# Patient Record
Sex: Female | Born: 1965 | Race: White | Hispanic: No | Marital: Married | Smoking: Current every day smoker
Health system: Southern US, Community
[De-identification: ages and names within clinical notes are randomized; demographics above are authoritative.]

## PROBLEM LIST (undated history)

## (undated) DIAGNOSIS — I10 Essential (primary) hypertension: Secondary | ICD-10-CM

## (undated) DIAGNOSIS — T83711A Erosion of implanted vaginal mesh and other prosthetic materials to surrounding organ or tissue, initial encounter: Secondary | ICD-10-CM

## (undated) DIAGNOSIS — E785 Hyperlipidemia, unspecified: Secondary | ICD-10-CM

## (undated) HISTORY — PX: DILATION AND CURETTAGE OF UTERUS: SHX78

## (undated) HISTORY — DX: Hyperlipidemia, unspecified: E78.5

---

## 1999-04-11 ENCOUNTER — Other Ambulatory Visit: Admission: RE | Admit: 1999-04-11 | Discharge: 1999-04-11 | Payer: Self-pay | Admitting: *Deleted

## 2000-05-01 ENCOUNTER — Other Ambulatory Visit: Admission: RE | Admit: 2000-05-01 | Discharge: 2000-05-01 | Payer: Self-pay | Admitting: *Deleted

## 2001-04-16 ENCOUNTER — Other Ambulatory Visit: Admission: RE | Admit: 2001-04-16 | Discharge: 2001-04-16 | Payer: Self-pay | Admitting: *Deleted

## 2002-08-09 ENCOUNTER — Other Ambulatory Visit: Admission: RE | Admit: 2002-08-09 | Discharge: 2002-08-09 | Payer: Self-pay | Admitting: Family Medicine

## 2002-09-06 ENCOUNTER — Encounter: Payer: Self-pay | Admitting: Cardiology

## 2002-09-06 ENCOUNTER — Encounter: Admission: RE | Admit: 2002-09-06 | Discharge: 2002-09-06 | Payer: Self-pay | Admitting: Cardiology

## 2002-09-07 ENCOUNTER — Encounter: Payer: Self-pay | Admitting: Cardiology

## 2002-09-07 ENCOUNTER — Ambulatory Visit (HOSPITAL_COMMUNITY): Admission: RE | Admit: 2002-09-07 | Discharge: 2002-09-07 | Payer: Self-pay | Admitting: Cardiology

## 2007-08-10 ENCOUNTER — Encounter: Admission: RE | Admit: 2007-08-10 | Discharge: 2007-08-10 | Payer: Self-pay | Admitting: Family Medicine

## 2007-08-17 ENCOUNTER — Encounter: Admission: RE | Admit: 2007-08-17 | Discharge: 2007-08-17 | Payer: Self-pay | Admitting: Family Medicine

## 2008-09-03 ENCOUNTER — Encounter: Admission: RE | Admit: 2008-09-03 | Discharge: 2008-09-03 | Payer: Self-pay | Admitting: Family Medicine

## 2008-12-17 ENCOUNTER — Emergency Department (HOSPITAL_COMMUNITY): Admission: EM | Admit: 2008-12-17 | Discharge: 2008-12-17 | Payer: Self-pay | Admitting: Family Medicine

## 2011-01-29 ENCOUNTER — Other Ambulatory Visit: Payer: Self-pay | Admitting: Obstetrics and Gynecology

## 2011-01-29 DIAGNOSIS — R928 Other abnormal and inconclusive findings on diagnostic imaging of breast: Secondary | ICD-10-CM

## 2011-02-05 ENCOUNTER — Ambulatory Visit
Admission: RE | Admit: 2011-02-05 | Discharge: 2011-02-05 | Disposition: A | Discharge status: Home / Self Care | Payer: Federal, State, Local not specified - PPO | Source: Ambulatory Visit | Attending: Obstetrics and Gynecology | Admitting: Obstetrics and Gynecology

## 2011-02-05 DIAGNOSIS — R928 Other abnormal and inconclusive findings on diagnostic imaging of breast: Secondary | ICD-10-CM

## 2014-02-03 ENCOUNTER — Other Ambulatory Visit: Payer: Self-pay | Admitting: Obstetrics & Gynecology

## 2014-02-16 ENCOUNTER — Encounter (HOSPITAL_COMMUNITY)
Admission: RE | Admit: 2014-02-16 | Discharge: 2014-02-16 | Disposition: A | Discharge status: Home / Self Care | Payer: Federal, State, Local not specified - PPO | Source: Ambulatory Visit | Attending: Obstetrics & Gynecology | Admitting: Obstetrics & Gynecology

## 2014-02-16 ENCOUNTER — Encounter (HOSPITAL_COMMUNITY): Payer: Self-pay

## 2014-02-16 DIAGNOSIS — Z01818 Encounter for other preprocedural examination: Secondary | ICD-10-CM | POA: Diagnosis present

## 2014-02-16 DIAGNOSIS — N812 Incomplete uterovaginal prolapse: Secondary | ICD-10-CM | POA: Insufficient documentation

## 2014-02-16 DIAGNOSIS — N393 Stress incontinence (female) (male): Secondary | ICD-10-CM | POA: Insufficient documentation

## 2014-02-16 HISTORY — DX: Essential (primary) hypertension: I10

## 2014-02-16 LAB — CBC
HEMATOCRIT: 41.8 % (ref 36.0–46.0)
HEMOGLOBIN: 14.6 g/dL (ref 12.0–15.0)
MCH: 31.1 pg (ref 26.0–34.0)
MCHC: 34.9 g/dL (ref 30.0–36.0)
MCV: 88.9 fL (ref 78.0–100.0)
Platelets: 224 10*3/uL (ref 150–400)
RBC: 4.7 MIL/uL (ref 3.87–5.11)
RDW: 13 % (ref 11.5–15.5)
WBC: 10.6 10*3/uL — ABNORMAL HIGH (ref 4.0–10.5)

## 2014-02-16 LAB — BASIC METABOLIC PANEL
Anion gap: 10 (ref 5–15)
BUN: 14 mg/dL (ref 6–23)
CO2: 27 mEq/L (ref 19–32)
CREATININE: 0.77 mg/dL (ref 0.50–1.10)
Calcium: 9.3 mg/dL (ref 8.4–10.5)
Chloride: 102 mEq/L (ref 96–112)
GLUCOSE: 88 mg/dL (ref 70–99)
POTASSIUM: 4.2 meq/L (ref 3.7–5.3)
Sodium: 139 mEq/L (ref 137–147)

## 2014-02-16 NOTE — Patient Instructions (Signed)
20 CHANIKA BYLAND  02/16/2014   Your procedure is scheduled on:  02/21/14  Enter through the Main Entrance of [facility] up the phone at the desk and dial [phone].   Call this number if you have problems the morning of surgery: [phone]   Remember:   Do not eat food:After Midnight.  Do not drink clear liquids: 4 Hours before arrival.  Take these medicines the morning of surgery with A SIP OF WATER: blood pressure medications   Do not wear jewelry, make-up or nail polish.  Do not wear lotions, powders, or perfumes. You may wear deodorant.  Do not shave 48 hours prior to surgery.  Do not bring valuables to the hospital.  [facility] is not   responsible for any belongings or valuables brought to the hospital.  Contacts, dentures or bridgework may not be worn into surgery.  Leave suitcase in the car. After surgery it may be brought to your room.  For patients admitted to the hospital, checkout time is 11:00 AM the day of              discharge.   Patients discharged the day of surgery will not be allowed to drive             home.  Name and phone number of your driver: NA  Special Instructions:      Please read over the following fact sheets that you were given:   Surgical Site Infection Prevention

## 2014-02-16 NOTE — Pre-Procedure Instructions (Signed)
EKG reviewed and accepted by Dr Primitivo Gauze.

## 2014-02-21 ENCOUNTER — Encounter (HOSPITAL_COMMUNITY)
Admission: RE | Disposition: A | Discharge status: Home / Self Care | Payer: Self-pay | Source: Ambulatory Visit | Attending: Obstetrics & Gynecology

## 2014-02-21 ENCOUNTER — Ambulatory Visit (HOSPITAL_COMMUNITY): Payer: Federal, State, Local not specified - PPO | Admitting: Anesthesiology

## 2014-02-21 ENCOUNTER — Encounter (HOSPITAL_COMMUNITY): Payer: Self-pay | Admitting: Anesthesiology

## 2014-02-21 ENCOUNTER — Encounter (HOSPITAL_COMMUNITY): Payer: Federal, State, Local not specified - PPO | Admitting: Anesthesiology

## 2014-02-21 ENCOUNTER — Observation Stay (HOSPITAL_COMMUNITY)
Admission: RE | Admit: 2014-02-21 | Discharge: 2014-02-22 | Disposition: A | Discharge status: Home / Self Care | Payer: Federal, State, Local not specified - PPO | Source: Ambulatory Visit | Attending: Obstetrics & Gynecology | Admitting: Obstetrics & Gynecology

## 2014-02-21 DIAGNOSIS — D251 Intramural leiomyoma of uterus: Secondary | ICD-10-CM | POA: Diagnosis not present

## 2014-02-21 DIAGNOSIS — N8111 Cystocele, midline: Secondary | ICD-10-CM | POA: Diagnosis not present

## 2014-02-21 DIAGNOSIS — N393 Stress incontinence (female) (male): Secondary | ICD-10-CM | POA: Insufficient documentation

## 2014-02-21 DIAGNOSIS — Z90710 Acquired absence of both cervix and uterus: Secondary | ICD-10-CM

## 2014-02-21 DIAGNOSIS — I10 Essential (primary) hypertension: Secondary | ICD-10-CM | POA: Insufficient documentation

## 2014-02-21 DIAGNOSIS — F17200 Nicotine dependence, unspecified, uncomplicated: Secondary | ICD-10-CM | POA: Diagnosis not present

## 2014-02-21 HISTORY — PX: VAGINAL HYSTERECTOMY: SHX2639

## 2014-02-21 HISTORY — PX: BLADDER SUSPENSION: SHX72

## 2014-02-21 HISTORY — DX: Acquired absence of both cervix and uterus: Z90.710

## 2014-02-21 HISTORY — PX: ANTERIOR AND POSTERIOR REPAIR: SHX5121

## 2014-02-21 LAB — PREGNANCY, URINE: Preg Test, Ur: NEGATIVE

## 2014-02-21 LAB — ABO/RH: ABO/RH(D): A POS

## 2014-02-21 LAB — TYPE AND SCREEN
ABO/RH(D): A POS
ANTIBODY SCREEN: NEGATIVE

## 2014-02-21 SURGERY — HYSTERECTOMY, VAGINAL
Anesthesia: General | Site: Vagina

## 2014-02-21 MED ORDER — ROCURONIUM BROMIDE 100 MG/10ML IV SOLN
INTRAVENOUS | Status: DC | PRN
  Administered 2014-02-21: 10 mg via INTRAVENOUS

## 2014-02-21 MED ORDER — KETOROLAC TROMETHAMINE 30 MG/ML IJ SOLN
INTRAMUSCULAR | Status: AC
  Filled 2014-02-21: qty 1

## 2014-02-21 MED ORDER — ESTRADIOL 0.1 MG/GM VA CREA
TOPICAL_CREAM | VAGINAL | Status: AC
  Filled 2014-02-21: qty 42.5

## 2014-02-21 MED ORDER — FENTANYL CITRATE 0.05 MG/ML IJ SOLN
25.0000 ug | INTRAMUSCULAR | Status: DC | PRN
  Administered 2014-02-21 (×2): 50 ug via INTRAVENOUS

## 2014-02-21 MED ORDER — SCOPOLAMINE 1 MG/3DAYS TD PT72
MEDICATED_PATCH | TRANSDERMAL | Status: AC
  Administered 2014-02-21: 1.5 mg via TRANSDERMAL
  Filled 2014-02-21: qty 1

## 2014-02-21 MED ORDER — PROPOFOL 10 MG/ML IV BOLUS
INTRAVENOUS | Status: DC | PRN
  Administered 2014-02-21: 200 mg via INTRAVENOUS

## 2014-02-21 MED ORDER — LIDOCAINE-EPINEPHRINE (PF) 1 %-1:200000 IJ SOLN
INTRAMUSCULAR | Status: DC | PRN
  Administered 2014-02-21: 5 mL
  Administered 2014-02-21: 15 mL

## 2014-02-21 MED ORDER — SIMETHICONE 80 MG PO CHEW
80.0000 mg | CHEWABLE_TABLET | Freq: Four times a day (QID) | ORAL | Status: DC
  Administered 2014-02-21 – 2014-02-22 (×2): 80 mg via ORAL
  Filled 2014-02-21 (×2): qty 1

## 2014-02-21 MED ORDER — CEFAZOLIN SODIUM-DEXTROSE 2-3 GM-% IV SOLR
2.0000 g | INTRAVENOUS | Status: AC
  Administered 2014-02-21: 2 g via INTRAVENOUS

## 2014-02-21 MED ORDER — IBUPROFEN 600 MG PO TABS
600.0000 mg | ORAL_TABLET | Freq: Four times a day (QID) | ORAL | Status: DC | PRN
  Administered 2014-02-22: 600 mg via ORAL
  Filled 2014-02-21: qty 1

## 2014-02-21 MED ORDER — FENTANYL CITRATE 0.05 MG/ML IJ SOLN
INTRAMUSCULAR | Status: AC
  Administered 2014-02-21: 50 ug via INTRAVENOUS
  Filled 2014-02-21: qty 2

## 2014-02-21 MED ORDER — KETOROLAC TROMETHAMINE 30 MG/ML IJ SOLN
INTRAMUSCULAR | Status: DC | PRN
  Administered 2014-02-21: 30 mg via INTRAVENOUS

## 2014-02-21 MED ORDER — PNEUMOCOCCAL VAC POLYVALENT 25 MCG/0.5ML IJ INJ
0.5000 mL | INJECTION | INTRAMUSCULAR | Status: DC
  Filled 2014-02-21: qty 0.5

## 2014-02-21 MED ORDER — PROMETHAZINE HCL 25 MG/ML IJ SOLN: 6.2500 mg | INTRAMUSCULAR | Status: DC | PRN

## 2014-02-21 MED ORDER — FENTANYL CITRATE 0.05 MG/ML IJ SOLN
INTRAMUSCULAR | Status: DC | PRN
  Administered 2014-02-21 (×8): 50 ug via INTRAVENOUS

## 2014-02-21 MED ORDER — KETOROLAC TROMETHAMINE 30 MG/ML IJ SOLN
30.0000 mg | Freq: Four times a day (QID) | INTRAMUSCULAR | Status: DC
  Administered 2014-02-21 – 2014-02-22 (×2): 30 mg via INTRAVENOUS
  Filled 2014-02-21 (×2): qty 1

## 2014-02-21 MED ORDER — MIDAZOLAM HCL 2 MG/2ML IJ SOLN: 0.5000 mg | Freq: Once | INTRAMUSCULAR | Status: DC | PRN

## 2014-02-21 MED ORDER — CEFAZOLIN SODIUM-DEXTROSE 2-3 GM-% IV SOLR
INTRAVENOUS | Status: AC
  Filled 2014-02-21: qty 50

## 2014-02-21 MED ORDER — LIDOCAINE-EPINEPHRINE (PF) 1 %-1:200000 IJ SOLN
INTRAMUSCULAR | Status: AC
  Filled 2014-02-21: qty 10

## 2014-02-21 MED ORDER — ONDANSETRON HCL 4 MG PO TABS: 4.0000 mg | ORAL_TABLET | Freq: Four times a day (QID) | ORAL | Status: DC | PRN

## 2014-02-21 MED ORDER — STERILE WATER FOR IRRIGATION IR SOLN
Status: DC | PRN
  Administered 2014-02-21: 1000 mL

## 2014-02-21 MED ORDER — PROPOFOL 10 MG/ML IV EMUL
INTRAVENOUS | Status: AC
  Filled 2014-02-21: qty 20

## 2014-02-21 MED ORDER — LACTATED RINGERS IV SOLN
INTRAVENOUS | Status: DC
  Administered 2014-02-21 (×3): via INTRAVENOUS

## 2014-02-21 MED ORDER — METHYLENE BLUE 1 % INJ SOLN
INTRAMUSCULAR | Status: DC | PRN
  Administered 2014-02-21: 1 mL via INTRAVENOUS
  Administered 2014-02-21: 5 mL via INTRAVENOUS

## 2014-02-21 MED ORDER — GLYCOPYRROLATE 0.2 MG/ML IJ SOLN
INTRAMUSCULAR | Status: AC
  Filled 2014-02-21: qty 1

## 2014-02-21 MED ORDER — ONDANSETRON HCL 4 MG/2ML IJ SOLN
INTRAMUSCULAR | Status: DC | PRN
Start: 2014-02-21 — End: 2014-02-21
  Administered 2014-02-21: 4 mg via INTRAVENOUS

## 2014-02-21 MED ORDER — METHYLENE BLUE 1 % INJ SOLN
INTRAMUSCULAR | Status: AC
  Filled 2014-02-21: qty 10

## 2014-02-21 MED ORDER — ROCURONIUM BROMIDE 100 MG/10ML IV SOLN
INTRAVENOUS | Status: AC
  Filled 2014-02-21: qty 1

## 2014-02-21 MED ORDER — KETOROLAC TROMETHAMINE 30 MG/ML IJ SOLN: 15.0000 mg | Freq: Once | INTRAMUSCULAR | Status: DC | PRN

## 2014-02-21 MED ORDER — HYDROMORPHONE HCL PF 1 MG/ML IJ SOLN
0.2000 mg | INTRAMUSCULAR | Status: DC | PRN
Start: 2014-02-21 — End: 2014-02-22
  Administered 2014-02-21 (×2): 0.6 mg via INTRAVENOUS
  Filled 2014-02-21 (×2): qty 1

## 2014-02-21 MED ORDER — DEXAMETHASONE SODIUM PHOSPHATE 4 MG/ML IJ SOLN
INTRAMUSCULAR | Status: DC | PRN
  Administered 2014-02-21: 8 mg via INTRAVENOUS

## 2014-02-21 MED ORDER — DEXAMETHASONE SODIUM PHOSPHATE 4 MG/ML IJ SOLN
INTRAMUSCULAR | Status: AC
  Filled 2014-02-21: qty 1

## 2014-02-21 MED ORDER — OXYCODONE-ACETAMINOPHEN 5-325 MG PO TABS
1.0000 | ORAL_TABLET | ORAL | Status: DC | PRN
  Administered 2014-02-22: 1 via ORAL
  Filled 2014-02-21: qty 1

## 2014-02-21 MED ORDER — DIPHENHYDRAMINE HCL 50 MG/ML IJ SOLN: 25.0000 mg | Freq: Four times a day (QID) | INTRAMUSCULAR | Status: DC | PRN

## 2014-02-21 MED ORDER — BISACODYL 5 MG PO TBEC
5.0000 mg | DELAYED_RELEASE_TABLET | Freq: Every day | ORAL | Status: DC | PRN
  Filled 2014-02-21: qty 1

## 2014-02-21 MED ORDER — FENTANYL CITRATE 0.05 MG/ML IJ SOLN
INTRAMUSCULAR | Status: AC
  Filled 2014-02-21: qty 5

## 2014-02-21 MED ORDER — ZOLPIDEM TARTRATE 5 MG PO TABS: 5.0000 mg | ORAL_TABLET | Freq: Every evening | ORAL | Status: DC | PRN

## 2014-02-21 MED ORDER — EPHEDRINE SULFATE 50 MG/ML IJ SOLN
INTRAMUSCULAR | Status: DC | PRN
  Administered 2014-02-21 (×3): 5 mg via INTRAVENOUS

## 2014-02-21 MED ORDER — LACTATED RINGERS IV SOLN
INTRAVENOUS | Status: DC
  Administered 2014-02-21 – 2014-02-22 (×2): via INTRAVENOUS

## 2014-02-21 MED ORDER — GLYCOPYRROLATE 0.2 MG/ML IJ SOLN
INTRAMUSCULAR | Status: DC | PRN
  Administered 2014-02-21: 0.1 mg via INTRAVENOUS

## 2014-02-21 MED ORDER — SCOPOLAMINE 1 MG/3DAYS TD PT72
1.0000 | MEDICATED_PATCH | TRANSDERMAL | Status: DC
  Administered 2014-02-21: 1.5 mg via TRANSDERMAL

## 2014-02-21 MED ORDER — LIDOCAINE HCL (CARDIAC) 20 MG/ML IV SOLN
INTRAVENOUS | Status: DC | PRN
  Administered 2014-02-21: 80 mg via INTRAVENOUS

## 2014-02-21 MED ORDER — DOCUSATE SODIUM 100 MG PO CAPS
100.0000 mg | ORAL_CAPSULE | Freq: Two times a day (BID) | ORAL | Status: DC
  Administered 2014-02-21 – 2014-02-22 (×2): 100 mg via ORAL
  Filled 2014-02-21 (×2): qty 1

## 2014-02-21 MED ORDER — MEPERIDINE HCL 25 MG/ML IJ SOLN: 6.2500 mg | INTRAMUSCULAR | Status: DC | PRN

## 2014-02-21 MED ORDER — 0.9 % SODIUM CHLORIDE (POUR BTL) OPTIME
TOPICAL | Status: DC | PRN
  Administered 2014-02-21: 1000 mL

## 2014-02-21 MED ORDER — METHYLENE BLUE 1 % INJ SOLN
INTRAMUSCULAR | Status: DC | PRN
  Administered 2014-02-21: 10 mg via INTRAVENOUS

## 2014-02-21 MED ORDER — ONDANSETRON HCL 4 MG/2ML IJ SOLN: 4.0000 mg | Freq: Four times a day (QID) | INTRAMUSCULAR | Status: DC | PRN

## 2014-02-21 MED ORDER — KETOROLAC TROMETHAMINE 30 MG/ML IJ SOLN: 30.0000 mg | Freq: Four times a day (QID) | INTRAMUSCULAR | Status: DC

## 2014-02-21 MED ORDER — MIDAZOLAM HCL 2 MG/2ML IJ SOLN
INTRAMUSCULAR | Status: AC
  Filled 2014-02-21: qty 2

## 2014-02-21 MED ORDER — MIDAZOLAM HCL 2 MG/2ML IJ SOLN
INTRAMUSCULAR | Status: DC | PRN
  Administered 2014-02-21: 2 mg via INTRAVENOUS

## 2014-02-21 MED ORDER — ESTRADIOL 0.1 MG/GM VA CREA
TOPICAL_CREAM | VAGINAL | Status: DC | PRN
  Administered 2014-02-21: 1 via VAGINAL

## 2014-02-21 MED ORDER — LIDOCAINE HCL (CARDIAC) 20 MG/ML IV SOLN
INTRAVENOUS | Status: AC
  Filled 2014-02-21: qty 5

## 2014-02-21 MED ORDER — ONDANSETRON HCL 4 MG/2ML IJ SOLN
INTRAMUSCULAR | Status: AC
  Filled 2014-02-21: qty 2

## 2014-02-21 MED ORDER — METHYLENE BLUE 1 % INJ SOLN
INTRAMUSCULAR | Status: AC
  Filled 2014-02-21: qty 1

## 2014-02-21 MED ORDER — LIDOCAINE-EPINEPHRINE 0.5 %-1:200000 IJ SOLN
INTRAMUSCULAR | Status: AC
  Filled 2014-02-21: qty 1

## 2014-02-21 SURGICAL SUPPLY — 55 items
BLADE 15 SAFETY STRL DISP (BLADE) ×3 IMPLANT
BLADE SURG 10 STRL SS (BLADE) ×6 IMPLANT
CANISTER SUCT 3000ML (MISCELLANEOUS) ×3 IMPLANT
CATH FOLEY 2WAY SLVR  5CC 18FR (CATHETERS) ×1
CATH FOLEY 2WAY SLVR 5CC 18FR (CATHETERS) ×2 IMPLANT
CATH FOLEY 3WAY  5CC 16FR (CATHETERS)
CATH FOLEY 3WAY 5CC 16FR (CATHETERS) IMPLANT
CLOTH BEACON ORANGE TIMEOUT ST (SAFETY) ×3 IMPLANT
CONT PATH 16OZ SNAP LID 3702 (MISCELLANEOUS) IMPLANT
DECANTER SPIKE VIAL GLASS SM (MISCELLANEOUS) ×3 IMPLANT
DERMABOND ADVANCED (GAUZE/BANDAGES/DRESSINGS) ×1
DERMABOND ADVANCED .7 DNX12 (GAUZE/BANDAGES/DRESSINGS) ×2 IMPLANT
DRAPE HYSTEROSCOPY (DRAPE) ×3 IMPLANT
ELECT LIGASURE LONG (ELECTRODE) ×3 IMPLANT
ELECT LIGASURE SHORT 9 REUSE (ELECTRODE) IMPLANT
GAUZE PACKING 1 X5 YD ST (GAUZE/BANDAGES/DRESSINGS) IMPLANT
GAUZE PACKING 2X5 YD STRL (GAUZE/BANDAGES/DRESSINGS) IMPLANT
GAUZE PACKING IODOFORM 1X5 (MISCELLANEOUS) IMPLANT
GLOVE BIO SURGEON STRL SZ 6.5 (GLOVE) ×9 IMPLANT
GLOVE BIO SURGEON STRL SZ7.5 (GLOVE) ×3 IMPLANT
GLOVE BIO SURGEON STRL SZ8 (GLOVE) ×3 IMPLANT
GLOVE BIOGEL PI IND STRL 7.5 (GLOVE) ×2 IMPLANT
GLOVE BIOGEL PI INDICATOR 7.5 (GLOVE) ×1
GLOVE INDICATOR 7.0 STRL GRN (GLOVE) ×3 IMPLANT
GOWN STRL REUS W/TWL LRG LVL3 (GOWN DISPOSABLE) ×12 IMPLANT
NEEDLE HYPO 22GX1.5 SAFETY (NEEDLE) ×3 IMPLANT
NS IRRIG 1000ML POUR BTL (IV SOLUTION) ×3 IMPLANT
PACK VAGINAL WOMENS (CUSTOM PROCEDURE TRAY) ×3 IMPLANT
PAD OB MATERNITY 4.3X12.25 (PERSONAL CARE ITEMS) ×3 IMPLANT
PLUG CATH AND CAP STER (CATHETERS) ×3 IMPLANT
SET CYSTO W/LG BORE CLAMP LF (SET/KITS/TRAYS/PACK) ×3 IMPLANT
SLING TRANS VAGINAL TAPE (Sling) ×1 IMPLANT
SLING UTERINE/ABD GYNECARE TVT (Sling) ×2 IMPLANT
SPONGE LAP 18X18 X RAY DECT (DISPOSABLE) ×3 IMPLANT
SUT SILK 3 0 SH CR/8 (SUTURE) IMPLANT
SUT VIC AB 0 CT1 18XCR BRD8 (SUTURE) ×6 IMPLANT
SUT VIC AB 0 CT1 27 (SUTURE) ×3
SUT VIC AB 0 CT1 27XBRD ANBCTR (SUTURE) ×6 IMPLANT
SUT VIC AB 0 CT1 8-18 (SUTURE) ×3
SUT VIC AB 0 CTB1 27 (SUTURE) ×6 IMPLANT
SUT VIC AB 2-0 CT1 (SUTURE) ×6 IMPLANT
SUT VIC AB 2-0 CT1 27 (SUTURE)
SUT VIC AB 2-0 CT1 TAPERPNT 27 (SUTURE) IMPLANT
SUT VIC AB 2-0 CT2 27 (SUTURE) ×6 IMPLANT
SUT VIC AB 2-0 SH 27 (SUTURE) ×2
SUT VIC AB 2-0 SH 27XBRD (SUTURE) ×4 IMPLANT
SUT VIC AB 2-0 UR6 27 (SUTURE) IMPLANT
SUT VIC AB 3-0 CT1 27 (SUTURE)
SUT VIC AB 3-0 CT1 TAPERPNT 27 (SUTURE) IMPLANT
SUT VIC AB 3-0 SH 27 (SUTURE)
SUT VIC AB 3-0 SH 27X BRD (SUTURE) IMPLANT
SUT VICRYL 0 TIES 12 18 (SUTURE) ×3 IMPLANT
TOWEL OR 17X24 6PK STRL BLUE (TOWEL DISPOSABLE) ×6 IMPLANT
TRAY FOLEY CATH 14FR (SET/KITS/TRAYS/PACK) ×3 IMPLANT
WATER STERILE IRR 1000ML POUR (IV SOLUTION) ×3 IMPLANT

## 2014-02-21 NOTE — Brief Op Note (Signed)
02/21/2014  3:03 PM  PATIENT:  Heather Liu  48 y.o. female  PRE-OPERATIVE DIAGNOSIS:  PROLAPSE / STRESS URINARY INCONTINENCE   POST-OPERATIVE DIAGNOSIS:  PROLAPSE / STRESS URINARY INCONTINENCE   PROCEDURE:  Procedure(s): HYSTERECTOMY VAGINAL (N/A) ANTERIOR (CYSTOCELE) REPAIR  (N/A) TRANSVAGINAL TAPE (TVT) PROCEDURE WITH CYSTO (N/A)  SURGEON:  Surgeon(s) and Role:    * Sanjuana Kava, MD - Primary    * Gus Height, MD - [place], MD - Assisting  PHYSICIAN ASSISTANT: N/A  ASSISTANTS: see above   ANESTHESIA:   local and general  EBL:  Total I/O In: 2000 [I.V.:2000] Out: 800 [Urine:600; Blood:200]  BLOOD ADMINISTERED:none  DRAINS: Urinary Catheter (Foley)   LOCAL MEDICATIONS USED:  LIDOCAINE  and Amount: 15 ml  SPECIMEN:  Source of Specimen:  Uterus with cervix  DISPOSITION OF SPECIMEN:  PATHOLOGY  COUNTS:  YES  TOURNIQUET:  * No tourniquets in log *  DICTATION: .Note written in EPIC  PLAN OF CARE: Admit for overnight observation  PATIENT DISPOSITION:  PACU - hemodynamically stable.   Delay start of Pharmacological VTE agent (>24hrs) due to surgical blood loss or risk of bleeding: not applicable  Findings: EUA: Stage II uterine prolapse and cystocele.  Normal appearing cervix, uterus, bilateral ovaries and fallopian tubes  Cystoscopy: normal bladder urothelium with brisk jets from both ureteral orifices.  Azul Coffie STACIA

## 2014-02-21 NOTE — Transfer of Care (Signed)
Immediate Anesthesia Transfer of Care Note  Patient: Heather Liu  Procedure(s) Performed: Procedure(s): HYSTERECTOMY VAGINAL (N/A) ANTERIOR (CYSTOCELE) REPAIR  (N/A) TRANSVAGINAL TAPE (TVT) PROCEDURE WITH CYSTO (N/A)  Patient Location: PACU  Anesthesia Type:General  Level of Consciousness: awake, alert  and oriented  Airway & Oxygen Therapy: Patient Spontanous Breathing and Patient connected to nasal cannula oxygen  Post-op Assessment: Report given to PACU RN and Post -op Vital signs reviewed and stable  Post vital signs: Reviewed and stable  Complications: No apparent anesthesia complications

## 2014-02-21 NOTE — Anesthesia Preprocedure Evaluation (Addendum)
Anesthesia Evaluation  Patient identified by MRN, date of birth, ID band Patient awake    Reviewed: Allergy & Precautions, H&P , Patient's Chart, lab work & pertinent test results, reviewed documented beta blocker date and time   History of Anesthesia Complications Negative for: history of anesthetic complications  Airway Mallampati: II TM Distance: >3 FB Neck ROM: full    Dental   Pulmonary Current Smoker,  breath sounds clear to auscultation        Cardiovascular Exercise Tolerance: Good hypertension, Rhythm:regular Rate:Normal     Neuro/Psych    GI/Hepatic   Endo/Other    Renal/GU      Musculoskeletal   Abdominal   Peds  Hematology   Anesthesia Other Findings   Reproductive/Obstetrics                          Anesthesia Physical Anesthesia Plan  ASA: II  Anesthesia Plan: General ETT   Post-op Pain Management:    Induction:   Airway Management Planned:   Additional Equipment:   Intra-op Plan:   Post-operative Plan:   Informed Consent: I have reviewed the patients History and Physical, chart, labs and discussed the procedure including the risks, benefits and alternatives for the proposed anesthesia with the patient or authorized representative who has indicated his/her understanding and acceptance.   Dental Advisory Given  Plan Discussed with: CRNA and Surgeon  Anesthesia Plan Comments:        Anesthesia Quick Evaluation

## 2014-02-21 NOTE — Anesthesia Postprocedure Evaluation (Signed)
  Anesthesia Post-op Note  Anesthesia Post Note  Patient: Heather Liu  Procedure(s) Performed: Procedure(s) (LRB): HYSTERECTOMY VAGINAL (N/A) ANTERIOR (CYSTOCELE) REPAIR  (N/A) TRANSVAGINAL TAPE (TVT) PROCEDURE WITH CYSTO (N/A)  Anesthesia type: General  Patient location: PACU  Post pain: Pain level controlled  Post assessment: Post-op Vital signs reviewed  Last Vitals:  Filed Vitals:   02/21/14 1600  BP: 153/73  Pulse: 58  Temp: 37.1 C  Resp: 14    Post vital signs: Reviewed  Level of consciousness: sedated  Complications: No apparent anesthesia complications

## 2014-02-21 NOTE — H&P (Signed)
Heather Liu is an 48 y.o. female  With symptomatic pelvic relaxatio. Patient notes that she has bulge symptoms, with the feeling 'something is between my legs' especially when active for prolonged periods of time. She denies pain or dyspareunia.  She also notes urinary stress incontinence with leaking with cough, sneeze and sometimes urine "just comes out" and it has progressively worsened in the past year. Patient She has to wear minipads for protection and changes 2x/day. Pt. also notes urgency symptoms, and reports loss of urine suddenly after feeling bladder is full. Pt. has to know where a bathroom is in most situations. She denies difficulty starting or stopping the urinary stream, no straining to urinate, no nocturia. Patient denies hematuria, dysuria or frequency. She reports 6-8 voids during the day. Patient denies incontinence of flatus, not stool, and denies needing splint and strain to have a bowel movement. No diarrhea, no hematochezia, no melena    Pertinent Gynecological History: Menses: post-menopausal Bleeding: none Contraception: none DES exposure: denies Blood transfusions: none Sexually transmitted diseases: no past history Previous GYN Procedures: DNC  Last mammogram: normal Date: 2015 Last pap: normal Date: 2015 OB History: G1, P2   Menstrual History: Menarche age: 50  No LMP recorded.    Past Medical History  Diagnosis Date  . Hypertension     Past Surgical History  Procedure Laterality Date  . Dilation and curettage of uterus      SAB    History reviewed. No pertinent family history.  Social History:  reports that she has been smoking Cigarettes.  She has been smoking about 0.00 packs per day. She does not have any smokeless tobacco history on file. She reports that she does not drink alcohol or use illicit drugs.  Allergies:  Allergies  Allergen Reactions  . Morphine And Related Itching  . Sulfa Antibiotics Hives and Rash    Prescriptions  prior to admission  Medication Sig Dispense Refill  . Cholecalciferol (VITAMIN D-3) 5000 UNITS TABS Take 1 capsule by mouth every other day.      . metoprolol succinate (TOPROL-XL) 25 MG 24 hr tablet Take 25 mg by mouth daily.      . Multiple Vitamins-Minerals (MULTIVITAMIN PO) Take 2 tablets by mouth daily.      Marland Kitchen telmisartan (MICARDIS) 80 MG tablet Take 80 mg by mouth daily.        Review of Systems  Constitutional: Negative for fever and chills.  Eyes: Negative for blurred vision and double vision.  Respiratory: Negative for cough.   Cardiovascular: Negative for chest pain and palpitations.  Gastrointestinal: Negative for heartburn and nausea.  Genitourinary: Negative for dysuria.  Musculoskeletal: Negative for myalgias.  Skin: Negative for itching and rash.  Neurological: Negative for dizziness and headaches.  Endo/Heme/Allergies: Does not bruise/bleed easily.  Psychiatric/Behavioral: Negative for depression.  All other systems reviewed and are negative.   Blood pressure 147/82, pulse 62, temperature 98.6 F (37 C), temperature source Oral, resp. rate 18, SpO2 100.00%. Physical Exam Female Genitalia: Vulva: no masses, atrophy, or lesions. Bladder/Urethra: no urethral discharge or mass and normal meatus and bladder non distended. Vagina no tenderness, erythema, rectocele, abnormal vaginal discharge, or vesicle(s) or ulcers and cystocele. Cervix: no discharge or cervical motion tenderness and grossly normal. Uterus: normal size and shape and midline, mobile, non-tender, and no uterine prolapse. Adnexa/Parametria: no parametrial tenderness or mass and no adnexal tenderness or ovarian mass. Pop-Q: C:-3, Ap:-1, Ba: 0, D: -1, Bp: -1, genital hiatus (GH): 2, perineal body (  pb): 3, and total vaginal length (TVL): 6.   Stress urinary incontinence as demonstrated on Urodynamics with no e/o ISD    Results for orders placed during the hospital encounter of 02/21/14 (from the past 24 hour(s))   PREGNANCY, URINE     Status: None   Collection Time    02/21/14 10:30 AM      Result Value Ref Range   Preg Test, Ur NEGATIVE  NEGATIVE  TYPE AND SCREEN     Status: None   Collection Time    02/21/14 10:32 AM      Result Value Ref Range   ABO/RH(D) A POS     Antibody Screen NEG     Sample Expiration 02/24/2014      No results found.  Assessment/Plan: 48 yo with Stress urinary incontinence as demonstrated on Urodynamics with no e/o ISD and Stage 2 Pelvic organ prolapse.  We discussed the possible management of SUI to include, but not limited to: no intervention (expectant), medication (for urge symptoms), and pessary, surgery : to include mid-urethral sling with fascia or synthetic mesh and cystoscopy vs. conservative management with pelvic floor PT and bladder re-training or biofeedback therapy. ; the risks, benefits, and alternatives were discussed with the patient in great detail. It was also explained to the patient that patients with overactive bladder symptoms may experience improvement of these symptoms after surgical treatment for stress incontinence. However, approximately one-third of the patients will experience no improvement in their urge symptoms, and one-third of the patients will actually report worsening of their urge symptoms.  Pt understands that the long-term success rates (after 5 years) are about 85%. Pt understands that the risks with this surgery include but are not limited to the correction may compress the urethra too much and block the flow of urine. Possible need for postoperative self-catheterization or prolonged indwelling catheter.  Other risks of surgery were discussed with the patient to include that of infection, pain, bleeding, damage to surrounding structures to include ureter, bladder, bowel, nerves, arteries and veins, possible scarring, dyspareunia, urinary retention and possible short or long-term self-catheterization or necessity of a prolonged  indwelling catheter. The 1-6% risk of mesh erosion or worsening of her urge incontinence / voiding dysfunction, de novo urge incontinence return of symptoms at a later date.  Patient desires to proceed with placement of mid-urethral sling placement at time of procedure.   The R/B of surgery were discussed w/ the pt to include, but not limited to bleeding/transfusion, infection, wound breakdown/poor healing, damage to organs in abd/pelvis w/ possible need for further surgical repair, need for abdominal incision to complete procedure, blood clot, PE/MI/stroke. Additionally, risks/benefits of ovarian preservation were discussed to include 1/70 lifetime risk of ovarian cancer and 5-10% risk for need of future surgery for ovarian pathology.  To OR for [facility] / possible A/P repair/ TVT / cystoscopy  Caden Fukushima, [place] 02/21/2014, 11:43 AM

## 2014-02-22 ENCOUNTER — Encounter (HOSPITAL_COMMUNITY): Payer: Self-pay | Admitting: Obstetrics & Gynecology

## 2014-02-22 DIAGNOSIS — N8111 Cystocele, midline: Secondary | ICD-10-CM | POA: Diagnosis not present

## 2014-02-22 LAB — CBC
HCT: 37 % (ref 36.0–46.0)
Hemoglobin: 12.7 g/dL (ref 12.0–15.0)
MCH: 30.5 pg (ref 26.0–34.0)
MCHC: 34.3 g/dL (ref 30.0–36.0)
MCV: 88.7 fL (ref 78.0–100.0)
Platelets: 224 10*3/uL (ref 150–400)
RBC: 4.17 MIL/uL (ref 3.87–5.11)
RDW: 13 % (ref 11.5–15.5)
WBC: 27.1 10*3/uL — ABNORMAL HIGH (ref 4.0–10.5)

## 2014-02-22 MED ORDER — DSS 100 MG PO CAPS: 100.0000 mg | ORAL_CAPSULE | Freq: Two times a day (BID) | ORAL | Status: DC

## 2014-02-22 MED ORDER — IBUPROFEN 600 MG PO TABS: 600.0000 mg | ORAL_TABLET | Freq: Four times a day (QID) | ORAL | Status: DC | PRN

## 2014-02-22 MED ORDER — HYDROCODONE-ACETAMINOPHEN 7.5-500 MG/15ML PO SOLN: 15.0000 mL | Freq: Four times a day (QID) | ORAL | Status: DC | PRN

## 2014-02-22 MED ORDER — ESTRADIOL 0.1 MG/GM VA CREA: 1.0000 | TOPICAL_CREAM | Freq: Every day | VAGINAL | Status: DC

## 2014-02-22 NOTE — Addendum Note (Signed)
Addendum created 02/22/14 5521 by Asher Muir, CRNA   Modules edited: Notes Section   Notes Section:  File: [id number]

## 2014-02-22 NOTE — Progress Notes (Signed)
Pt discharged to home with husband.  Condition stable.  Pt ambulated to car with RN.  No equipment for home ordered at discharge.

## 2014-02-22 NOTE — Progress Notes (Signed)
1 Day Post-Op Procedure(s) (LRB): HYSTERECTOMY VAGINAL (N/A) ANTERIOR (CYSTOCELE) REPAIR  (N/A) TRANSVAGINAL TAPE (TVT) PROCEDURE WITH CYSTO (N/A)  Subjective: Patient reports vaginal soreness. She is tolerating PO and + flatus.   She hasn't been out of bed yet. No chest pain, no shortness of breath  Objective: I have reviewed patient's vital signs, intake and output, medications and labs.  General: alert, cooperative and no distress GI: soft, non-tender; bowel sounds normal; no masses,  no organomegaly and incision: clean, dry and intact Extremities: extremities normal, atraumatic, no cyanosis or edema, Homans sign is negative, no sign of DVT and no edema, redness or tenderness in the calves or thighs Vaginal Bleeding: minimal vaginal packing removed  Bladder back filled with 258mL of sterile water and foley catheter removed  Assessment: s/p Procedure(s): HYSTERECTOMY VAGINAL (N/A) ANTERIOR (CYSTOCELE) REPAIR  (N/A) TRANSVAGINAL TAPE (TVT) PROCEDURE WITH CYSTO (N/A): stable, progressing well and tolerating diet  Plan: Encourage ambulation Advance to PO medication Discontinue IV  Will start voiding trials now.  Patient aware that if she passes one she can go home without leg bag If she fails one will do another trial and if she fails will send home with a leg bag.  Discharge home today with f/u in office  LOS: 1 day    [place], [place] 02/22/2014, 9:20 AM

## 2014-02-22 NOTE — Anesthesia Postprocedure Evaluation (Signed)
Anesthesia Post Note  Patient: Heather Liu  Procedure(s) Performed: Procedure(s) (LRB): HYSTERECTOMY VAGINAL (N/A) ANTERIOR (CYSTOCELE) REPAIR  (N/A) TRANSVAGINAL TAPE (TVT) PROCEDURE WITH CYSTO (N/A)  Anesthesia type: General  Patient location: [facility]  Post pain: Pain level controlled  Post assessment: Post-op Vital signs reviewed  Last Vitals:  Filed Vitals:   02/22/14 0616  BP: 103/52  Pulse: 62  Temp: 37.4 C  Resp: 18    Post vital signs: Reviewed  Level of consciousness: sedated  Complications: No apparent anesthesia complications

## 2014-02-23 LAB — WOUND CULTURE: Culture: NO GROWTH

## 2014-02-23 NOTE — Discharge Summary (Signed)
Physician Discharge Summary  Patient ID: Heather Liu MRN: [id number] DOB/AGE: 02-Aug-1965 48 y.o.  Admit date: 02/21/2014 Discharge date: 02/23/2014  Admission Diagnoses: Symptomatic pelvic relaxation  Discharge Diagnoses:  Active Problems:   S/P vaginal hysterectomy   Discharged Condition: good  Hospital Course: Patient taken to OR for scheduled TVH , Anterior repair, TVT, cystoscopy.  There were no intraoperative or post operative complications. Patient was meeting all discharge Criteria, passed voiding trials and was discharged home on POD#1  Consults: None  Significant Diagnostic Studies: labs: Hb 12/27 post op  Treatments: IV hydration, surgery: TVH / Anterior repair / TVT / Cystoscopy and voiding trials w/ bladder scans  Discharge Exam: Blood pressure 133/76, pulse 60, temperature 98.4 F (36.9 C), temperature source Oral, resp. rate 18, SpO2 99.00%. General appearance: alert, cooperative and no distress Resp: clear to auscultation bilaterally Pelvic: deferred and no vaginal bleeding / packing removed Extremities: extremities normal, atraumatic, no cyanosis or edema, Homans sign is negative, no sign of DVT and no edema, redness or tenderness in the calves or thighs  Disposition: 01-Home or Self Care  Discharge Instructions   Call MD for:  difficulty breathing, headache or visual disturbances    Complete by:  As directed      Call MD for:  extreme fatigue    Complete by:  As directed      Call MD for:  hives    Complete by:  As directed      Call MD for:  persistant dizziness or light-headedness    Complete by:  As directed      Call MD for:  persistant nausea and vomiting    Complete by:  As directed      Call MD for:  redness, tenderness, or signs of infection (pain, swelling, redness, odor or green/yellow discharge around incision site)    Complete by:  As directed      Call MD for:  severe uncontrolled pain    Complete by:  As directed      Call MD for:   temperature >100.4    Complete by:  As directed      Call MD for:    Complete by:  As directed   Bright red vaginal bleeding or abnormal vaginal discharge     Diet - low sodium heart healthy    Complete by:  As directed      Discharge instructions    Complete by:  As directed   Call if you have any questions or concerns.     Driving Restrictions    Complete by:  As directed   No driving for 2 weeks or while taking narcotic medication     Increase activity slowly    Complete by:  As directed      Lifting restrictions    Complete by:  As directed   No lifting anything heavier than 20lbs     Other Restrictions    Complete by:  As directed   No tubs, pools, soaking in water for at least 3-4 weeks     Sexual Activity Restrictions    Complete by:  As directed   No intercourse for at least 4 weeks            Medication List         DSS 100 MG Caps  Take 100 mg by mouth 2 (two) times daily.     estradiol 0.1 MG/GM vaginal cream  Commonly known as:  ESTRACE VAGINAL  Place 1 Applicatorful vaginally at bedtime. Use every other day for two weeks. Then use twice weekly     HYDROcodone-acetaminophen 7.5-500 MG/15ML solution  Commonly known as:  LORTAB  Take 15 mLs by mouth every 6 (six) hours as needed for pain.     ibuprofen 600 MG tablet  Commonly known as:  ADVIL,MOTRIN  Take 1 tablet (600 mg total) by mouth every 6 (six) hours as needed (mild pain).     metoprolol succinate 25 MG 24 hr tablet  Commonly known as:  TOPROL-XL  Take 25 mg by mouth daily.     MULTIVITAMIN PO  Take 2 tablets by mouth daily.     telmisartan 80 MG tablet  Commonly known as:  MICARDIS  Take 80 mg by mouth daily.     Vitamin D-3 5000 UNITS Tabs  Take 1 capsule by mouth every other day.         SignedCaffie Damme 02/23/2014, 1:26 AM

## 2014-02-26 LAB — ANAEROBIC CULTURE

## 2014-03-01 NOTE — Op Note (Signed)
OPERATIVE NOTE  Heather Liu  DOB:    Dec 08, 1965  MRN:    [id number]  CSN:    [id number]  Date of Surgery:  02/21/2014  Preoperative Diagnosis: Symptomatic pelvic relaxation, stress urinary incontinence  Postoperative Diagnosis: Same as above  Procedure: Total Vaginal hysterectomy, Anterior colporrhaphy, Tension free Vaginal tape, cystoscopy  Surgeon: Mady Haagensen. Anjela Cassara MD  Assistant: Melinda Crutch MD Bobbye Charleston MD  Anesthetic: General  Disposition: The patient presents with the above-mentioned diagnosis. She understands the indications for surgical procedure.  She also understands the alternative treatment options. She accepts the risk of, but not limited to, anesthetic complications, bleeding, infections, and possible damage to the surrounding organs.  Findings: Exam under anesthesia: normal vulva, vaginal vault normal cervix grossly normal Stage II cystocele and  Uterine prolapse.  Uterus grossly  Normal. Bilateral fallopian tubes and ovaries normal.  Cystoscopy: Normal bladder urothelium, brisk ureteral jets after procedure  Procedure: Patient was taken to the operating room where General Anesthesia was administered. She was placed in dorsal lithotomy position and examined under anesthesia with findings noted above.  Patient was prepped and draped in normal sterile fashion. A weighted speculum was placed in the posterior fornix and a Deaver retractor anteriorly retracting the bladder. The cervix was grasped with Ardis Hughs tenaculum and pulled into view. 74mL of dilute lidocaine with epinephrine was circumferentially injected.  A circumferential incision was made at the cervicovaginal junction. The incision was carried down sharply anteriorly to the endopelvic fascia. The bladder was dissected away from the anterior surface of the uterus using blunt dissection. Entry into the anterior cul de sac could not be performed at this time so attention was turned posteriorly. The  posterior aspect of the mucosal incision was then dissected sharply. The peritoneum was identified and incised, and the posterior cul de sac explored. An interrupted suture of 0-Vicryl was placed were placed to affix the posterior peritoneum to the posterior vaginal mucosa. A weighted duck-billed was placed in the posterior cul de sac.    The uterosacral ligaments were clamped with Heaney clamps, cut and ligated with #0-Vicryl suture bilaterally. The ligated pedicle was held for later placation by an identifying hemostat that would be used later during vaginal vault suspension. The anterior vesicouterine peritoneal reflection was now identified and sharply excised. The anterior cul de sac was explored. The Deaver retractor was placed through the anterior cul de sac. The remainder of the cardinal and broad ligaments were then serially clamped, cut and ligated bilaterally using 0-Vicryl. The uterine fundus was delivered with a single tooth tenaculum prior to clamping the upper portion of the broad ligaments, the uterine end of the tubes and the suspensory ligament of the ovary and round ligament. This vascular pedicle was transfixed with a stay suture on the outside of a previously placed free tie ligature.  The uterus was removed intact and submitted to pathology for examination. The bowel was packed away with a moist laparotomy pack and the ovaries and fallopian tubes were identified and appeared normal. The pedicles were examined and there was slight oozing from the right utero-ovarian ligament.  A running suture of 2-0 vicryl was used to ensure hemostasis. The pack was then removed from the abdomen.   A #2.0 Vicryl suture was placed in a purse string fashion to close the peritoneum. A Modified McCall's culdoplasty was then performed by placing suture through the posterior fornix, passed through each uterosacral ligament before returning through the cul de sac, peritoneum, and posterior fornix.  A Foley  catheter was placed into the bladder. The anterior vagina was infiltrated in the submucosal layer with 20cc of dilute lidocaine with epinephrine. The vagina was held with Allis clamps and a midline incision was made through the vaginal mucosa beginning at the apex and extending to within 1cm of the external urethral meatus. The vaginal mucosa was sharply dissected laterally using Metzenbaum scissors. The paraurethral and paravescial fascia was mobilized by blunt dissection using a single layer of gauze sponge.  Careful submucosal dissection was performed to identify the endopelvic fascia. Two small stab incisions using a #15 scalpel was made along the skin of the suprapubic region 2cm lateral to the midline on either side.  Gynecare TVT trocar was then placed through stab incisions into the space of Retzius, across the back of the pubis to exit through the skin, along the right side of the urethra. The identical process was performed to the left of the urethra without difficulty.  Both trocars were left in place.  Foley catheter was removed and cystoscope was inserted.  Intravenous methylene blue was previously given by Anesthesia.  Complete evaluation of the bladder mucosa was performed noting no lacerations, dimpling, tears, bleeding or trocar penetration of the mucosa or muscular layers.  Both ureteral orifices were identified.  Prompt excretion of blue-tinged urine from both ureteral orifices was noted.  Cystoscope was withdrawn.  Both trocars were probed through the abdominal skin to place the vaginal tape beneath the midurethra.  Careful tensioning of the tape was performed using a Kelly clamp placed vertically beneath the urethra to allow proper tensioning.  Sheaths were removed from both mesh tapes.  Tapes were pinned with the skin line and the skin was closed Dermabond.    The anterior repair was performed by placing a vertical mattress stitch of @2 .0 Vicryl was placed in the paraurethral fascia to the  pubourethral ligament at the urethral-vesical junction. The remainder of the paraurethral fascia was reapproximated using similar mattress sutures. Approximately 0.5cm of redundant vaginal mucosa was excised from each midline edge. The anterior vaginal mucosa was closed transversely with running, locked suture of #2.0 Vicryl.  The previously held suspension sutures from the hysterectomy were then tied internally and a transverse closure of the vaginal vault was then performed with interrupted sutures using 0 Vicryl. An Estrace soaked vaginal pack was placed into the vagina, and the patient awakened from general anesthesia without difficulty.   Sponge, instrument and needle counts were correct. There were no complications. The patient was transferred to the recovery room in satisfactory condition.  Searra Carnathan STACIA

## 2015-06-23 ENCOUNTER — Other Ambulatory Visit: Payer: Self-pay | Admitting: Obstetrics and Gynecology

## 2015-06-25 DIAGNOSIS — T83711A Erosion of implanted vaginal mesh and other prosthetic materials to surrounding organ or tissue, initial encounter: Secondary | ICD-10-CM | POA: Insufficient documentation

## 2015-06-25 HISTORY — DX: Erosion of implanted vaginal mesh to surrounding organ or tissue, initial encounter: T83.711A

## 2015-09-19 ENCOUNTER — Other Ambulatory Visit: Payer: Self-pay | Admitting: Urology

## 2015-11-01 ENCOUNTER — Encounter (HOSPITAL_BASED_OUTPATIENT_CLINIC_OR_DEPARTMENT_OTHER): Payer: Self-pay | Admitting: *Deleted

## 2015-11-01 NOTE — Progress Notes (Signed)
To [facility] at 0715-Istat,Ekg on arrival-instructed Npo after Mn-to take micardis,toprol xr am procedure with small amt water -refrain from smoking also.

## 2015-11-08 NOTE — H&P (Signed)
Reason For Visit F/u to review urodynamic results   Active Problems Problems  1. Erosion of vaginal mesh, initial encounter (T83.711A) 2. Erosion of vaginal mesh, subsequent encounter (T83.711D) 3. Urge and stress incontinence (N39.46)  History of Present Illness     50 yo female returns today to review urodynamic results. Originally referred by Dr. Philis Pique for pelvic pain. She has history of uterine prolapse, and underwent vaginal hysterectomy in September 2015, with associated bladder fall repair, and urethral sling for stress urinary incontinence with a Gynecare TVT tape. Following her hysterectomy, the patient's husband noted that he always felt a "fishing line" across the vaginal opening, during sexual activity. The patient also noted postcoital vaginal pain. Pelvic examination found the area of pain, and the patient has also felt the pain on the left side of the vagina and in her lower back. She noted new onset of incontinence 5 months ago, with cough with an sneeze incontinence. She also notes urgency and urge incontinence. She has a tobacco history of 35 pack years. She is para 1-2-0.   Physical examination show patient has a BMI of 26, with a height of 5 foot 6 and weight of 162 pounds. Urinalysis is negative.    Urodynamics on 08/23/15, shows a cystometric capacity of 460 cc. First sensation occurs at 59 cc, with strong desire at 168 cc. There is no obvious instability noted. Valsalva leak point pressure is evaluated, and the patient did not leak for Valsalva leak point determination at abdominal pressure generation of 120 cm of water pressure.    Pressure flow studies accomplished, and a voluntary contraction was generated with maximum flow rate of 19 cc/s with a detrusor pressure at maximum flow of 8 cm water pressure. PV R is 30-40 cc. EMGs increased during voiding due to poor relaxation of the external sphincter. The bladder neck dissections 1 cm or less. Cystogram was  accomplished, shows no bony abnormalities, and no reflux.    The patient has a maximum capacity of 460 cc. There is no stress incontinence noted during the study, with reasonable abdominal pressures generated. There is no obvious instability noted.   Past Medical History Problems  1. History of Anxiety (F41.9) 2. History of depression (Z86.59) 3. History of hypertension (Z86.79) 4. History of malignant melanoma [id number])  Surgical History Problems  1. History of Hysterectomy  Current Meds 1. Estrace 0.1 MG/GM Vaginal Cream; APPLY/RUB 1/2 INCH TO VAGINAL OPENING &  URETHRAL AREA;  Therapy: [id number] to (Last Rx:22Feb2017)  Requested for: 22Feb2017 Ordered 2. Micardis 80 MG Oral Tablet;  Therapy: (Recorded:22Feb2017) to Recorded 3. Multi Vitamin/Minerals TABS;  Therapy: (Recorded:22Feb2017) to Recorded 4. Omega 3 CAPS;  Therapy: (Recorded:22Feb2017) to Recorded 5. Toprol XL 200 MG TBCR;  Therapy: (Recorded:22Feb2017) to Recorded  Allergies Medication  1. Sulfa Drugs  Family History Problems  1. Family history of pancreatic cancer (Z80.0) : Father  Social History Problems  1. Denied: History of Alcohol use 2. Caffeine use (F15.90) 3. Current smoker (F17.200) 4. Married  Review of Systems Genitourinary, constitutional, skin, eye, otolaryngeal, hematologic/lymphatic, cardiovascular, pulmonary, endocrine, musculoskeletal, gastrointestinal, neurological and psychiatric system(s) were reviewed and pertinent findings if present are noted and are otherwise negative.  Genitourinary: nocturia, incontinence, hematuria, pelvic pain and dyspareunia.  Gastrointestinal: nausea and heartburn.  Constitutional: night sweats.  Psychiatric: anxiety.    Vitals Vital Signs [Data Includes: Last 1 Day]  Recorded: 21Mar2017 03:51PM  Blood Pressure: 125 / 75 Temperature: 98.6 F Heart Rate: 60  Physical Exam ENT:. The  ears and nose are normal in appearance.  Neck: The appearance of  the neck is normal.  Pulmonary: No respiratory distress and normal respiratory rhythm and effort.  Cardiovascular:. No peripheral edema.  Abdomen: The abdomen is flat. The abdomen is normal to percussion.  Genitourinary:  Chaperone Present: kim lewis.  Examination of the external genitalia shows no vulvar atrophy, no condyloma acuminatum and no labial adhesions. The urethra is tender, but does not appear stenotic and no urethral caruncle. There is no urethral mass. Urethral hypermobility is not present. There is no urethral discharge. No periurethral cyst is identified. There is no urethral prolapse. Vaginal extrusion of Gynecare TVT tape left greater than right side. Tender to palpation. Vaginal exam demonstrates tenderness and the vaginal epithelium to be poorly estrogenized, but no abnormalities, no atrophy and no discharge. No cystocele is identified. No enterocele is identified. No rectocele is identified. There is no evidence of a vesicovaginal fistula. The cervix is is absent. The uterus is absent, but non tender. The bladder is non tender, not distended and without masses. The anus is normal on inspection.  Lymphatics: The femoral and inguinal nodes are not enlarged or tender.  Skin: Normal skin turgor, no visible rash and no visible skin lesions.  Neuro/Psych:. Mood and affect are appropriate.    Results/Data Urine [Data Includes: Last 1 Day]   [id number]  COLOR YELLOW   APPEARANCE CLEAR   SPECIFIC GRAVITY <1.005   pH 7.0   GLUCOSE NEGATIVE   BILIRUBIN NEGATIVE   KETONE NEGATIVE   BLOOD NEGATIVE   PROTEIN NEGATIVE   NITRITE NEGATIVE   LEUKOCYTE ESTERASE NEGATIVE    Assessment Assessed  1. Urge and stress incontinence (N39.46) 2. Erosion of vaginal mesh, subsequent encounter (T75.29D)  50 year old female with extrusion of Gynecare TVT tape. The patient is symptomatic, and will need removal of the tape. She will have implantation of Coloplast axis dermis as covering. I think  this can be done as an outpatient, with no packing is no catheter. This will be accomplished at [facility] in April. We have reviewed her urodynamics, and that she has a stable bladder. I do not think she will need to have additional stress urinary incontinence surgery. I will try to remove approximately 80% of the TVT tape, or as much as I can get out as possible.   Plan Repair vaginal extrusion. Scheduled surgery for April.   Discussion/Summary cc: Bobbye Charleston, MD     Signatures Electronically signed by : Carolan Clines, M.D.; Sep 12 2015  5:18PM EST

## 2015-11-09 ENCOUNTER — Other Ambulatory Visit: Payer: Self-pay

## 2015-11-09 ENCOUNTER — Ambulatory Visit (HOSPITAL_BASED_OUTPATIENT_CLINIC_OR_DEPARTMENT_OTHER): Payer: Federal, State, Local not specified - PPO | Admitting: Anesthesiology

## 2015-11-09 ENCOUNTER — Encounter (HOSPITAL_BASED_OUTPATIENT_CLINIC_OR_DEPARTMENT_OTHER)
Admission: RE | Disposition: A | Discharge status: Home / Self Care | Payer: Self-pay | Source: Ambulatory Visit | Attending: Urology

## 2015-11-09 ENCOUNTER — Ambulatory Visit (HOSPITAL_BASED_OUTPATIENT_CLINIC_OR_DEPARTMENT_OTHER)
Admission: RE | Admit: 2015-11-09 | Discharge: 2015-11-09 | Disposition: A | Discharge status: Home / Self Care | Payer: Federal, State, Local not specified - PPO | Source: Ambulatory Visit | Attending: Urology | Admitting: Urology

## 2015-11-09 ENCOUNTER — Encounter (HOSPITAL_BASED_OUTPATIENT_CLINIC_OR_DEPARTMENT_OTHER): Payer: Self-pay | Admitting: *Deleted

## 2015-11-09 DIAGNOSIS — I10 Essential (primary) hypertension: Secondary | ICD-10-CM | POA: Diagnosis not present

## 2015-11-09 DIAGNOSIS — N8111 Cystocele, midline: Secondary | ICD-10-CM | POA: Diagnosis not present

## 2015-11-09 DIAGNOSIS — N3946 Mixed incontinence: Secondary | ICD-10-CM | POA: Insufficient documentation

## 2015-11-09 DIAGNOSIS — T83711D Erosion of implanted vaginal mesh and other prosthetic materials to surrounding organ or tissue, subsequent encounter: Secondary | ICD-10-CM

## 2015-11-09 DIAGNOSIS — N8182 Incompetence or weakening of pubocervical tissue: Secondary | ICD-10-CM | POA: Diagnosis not present

## 2015-11-09 DIAGNOSIS — T83711A Erosion of implanted vaginal mesh and other prosthetic materials to surrounding organ or tissue, initial encounter: Secondary | ICD-10-CM | POA: Insufficient documentation

## 2015-11-09 DIAGNOSIS — Z79890 Hormone replacement therapy (postmenopausal): Secondary | ICD-10-CM | POA: Diagnosis not present

## 2015-11-09 DIAGNOSIS — T83721A Exposure of implanted vaginal mesh and other prosthetic materials into vagina, initial encounter: Secondary | ICD-10-CM | POA: Diagnosis not present

## 2015-11-09 DIAGNOSIS — R102 Pelvic and perineal pain: Secondary | ICD-10-CM | POA: Diagnosis present

## 2015-11-09 DIAGNOSIS — Z79899 Other long term (current) drug therapy: Secondary | ICD-10-CM | POA: Insufficient documentation

## 2015-11-09 DIAGNOSIS — M7989 Other specified soft tissue disorders: Secondary | ICD-10-CM | POA: Diagnosis not present

## 2015-11-09 DIAGNOSIS — F17200 Nicotine dependence, unspecified, uncomplicated: Secondary | ICD-10-CM | POA: Diagnosis not present

## 2015-11-09 HISTORY — DX: Erosion of implanted vaginal mesh to surrounding organ or tissue, initial encounter: T83.711A

## 2015-11-09 HISTORY — PX: BLADDER SUSPENSION: SHX72

## 2015-11-09 HISTORY — PX: TRANSVAGINAL TAPE (TVT) REMOVAL: SHX6154

## 2015-11-09 LAB — POCT I-STAT, CHEM 8
BUN: 10 mg/dL (ref 6–20)
CALCIUM ION: 1.22 mmol/L (ref 1.12–1.23)
CHLORIDE: 107 mmol/L (ref 101–111)
Creatinine, Ser: 0.6 mg/dL (ref 0.44–1.00)
Glucose, Bld: 92 mg/dL (ref 65–99)
HCT: 44 % (ref 36.0–46.0)
Hemoglobin: 15 g/dL (ref 12.0–15.0)
Potassium: 3.9 mmol/L (ref 3.5–5.1)
SODIUM: 144 mmol/L (ref 135–145)
TCO2: 23 mmol/L (ref 0–100)

## 2015-11-09 SURGERY — TRANSVAGINAL TAPE (TVT) REMOVAL
Anesthesia: General

## 2015-11-09 MED ORDER — PROPOFOL 10 MG/ML IV BOLUS
INTRAVENOUS | Status: DC | PRN
  Administered 2015-11-09: 200 mg via INTRAVENOUS
  Administered 2015-11-09: 50 mg via INTRAVENOUS

## 2015-11-09 MED ORDER — ONDANSETRON HCL 4 MG/2ML IJ SOLN
INTRAMUSCULAR | Status: DC | PRN
  Administered 2015-11-09: 4 mg via INTRAVENOUS

## 2015-11-09 MED ORDER — ESTRADIOL 0.1 MG/GM VA CREA
TOPICAL_CREAM | VAGINAL | Status: DC | PRN
  Administered 2015-11-09: 1 via VAGINAL

## 2015-11-09 MED ORDER — LIDOCAINE HCL (CARDIAC) 20 MG/ML IV SOLN
INTRAVENOUS | Status: AC
  Filled 2015-11-09: qty 5

## 2015-11-09 MED ORDER — FENTANYL CITRATE (PF) 100 MCG/2ML IJ SOLN
INTRAMUSCULAR | Status: AC
  Filled 2015-11-09: qty 2

## 2015-11-09 MED ORDER — CEFAZOLIN SODIUM-DEXTROSE 2-4 GM/100ML-% IV SOLN
2.0000 g | INTRAVENOUS | Status: AC
  Administered 2015-11-09: 2 g via INTRAVENOUS
  Filled 2015-11-09: qty 100

## 2015-11-09 MED ORDER — EPHEDRINE SULFATE 50 MG/ML IJ SOLN
INTRAMUSCULAR | Status: AC
  Filled 2015-11-09: qty 1

## 2015-11-09 MED ORDER — LIDOCAINE 2% (20 MG/ML) 5 ML SYRINGE
INTRAMUSCULAR | Status: DC | PRN
  Administered 2015-11-09: 60 mg via INTRAVENOUS

## 2015-11-09 MED ORDER — DOCUSATE SODIUM 100 MG PO CAPS: 100.0000 mg | ORAL_CAPSULE | Freq: Two times a day (BID) | ORAL | Status: DC

## 2015-11-09 MED ORDER — MIDAZOLAM HCL 2 MG/2ML IJ SOLN
INTRAMUSCULAR | Status: AC
  Filled 2015-11-09: qty 2

## 2015-11-09 MED ORDER — CEFAZOLIN SODIUM-DEXTROSE 2-4 GM/100ML-% IV SOLN
INTRAVENOUS | Status: AC
  Filled 2015-11-09: qty 100

## 2015-11-09 MED ORDER — DEXAMETHASONE SODIUM PHOSPHATE 4 MG/ML IJ SOLN
INTRAMUSCULAR | Status: DC | PRN
  Administered 2015-11-09: 10 mg via INTRAVENOUS

## 2015-11-09 MED ORDER — FENTANYL CITRATE (PF) 100 MCG/2ML IJ SOLN
25.0000 ug | INTRAMUSCULAR | Status: DC | PRN
  Filled 2015-11-09: qty 1

## 2015-11-09 MED ORDER — DEXAMETHASONE SODIUM PHOSPHATE 10 MG/ML IJ SOLN
INTRAMUSCULAR | Status: AC
  Filled 2015-11-09: qty 1

## 2015-11-09 MED ORDER — STERILE WATER FOR IRRIGATION IR SOLN
Status: DC | PRN
  Administered 2015-11-09: 5 mL

## 2015-11-09 MED ORDER — SODIUM CHLORIDE 0.9 % IV SOLN
INTRAVENOUS | Status: DC | PRN
  Administered 2015-11-09: 5 mL via VAGINAL
  Administered 2015-11-09: 10 mL via VAGINAL

## 2015-11-09 MED ORDER — PROMETHAZINE HCL 25 MG/ML IJ SOLN
6.2500 mg | INTRAMUSCULAR | Status: DC | PRN
  Filled 2015-11-09: qty 1

## 2015-11-09 MED ORDER — HYDROCODONE-ACETAMINOPHEN 5-325 MG PO TABS: 1.0000 | ORAL_TABLET | Freq: Four times a day (QID) | ORAL | Status: DC | PRN

## 2015-11-09 MED ORDER — FLUORESCEIN SODIUM 10 % IV SOLN
INTRAVENOUS | Status: DC | PRN
Start: 2015-11-09 — End: 2015-11-09
  Administered 2015-11-09: 50 mg via INTRAVENOUS

## 2015-11-09 MED ORDER — PROPOFOL 10 MG/ML IV BOLUS
INTRAVENOUS | Status: AC
  Filled 2015-11-09: qty 20

## 2015-11-09 MED ORDER — LACTATED RINGERS IV SOLN
INTRAVENOUS | Status: DC
  Administered 2015-11-09 (×2): via INTRAVENOUS
  Filled 2015-11-09: qty 1000

## 2015-11-09 MED ORDER — SODIUM CHLORIDE 0.9 % IR SOLN
Status: DC | PRN
  Administered 2015-11-09: 500 mL

## 2015-11-09 MED ORDER — ONDANSETRON HCL 4 MG/2ML IJ SOLN
INTRAMUSCULAR | Status: AC
  Filled 2015-11-09: qty 2

## 2015-11-09 MED ORDER — FENTANYL CITRATE (PF) 100 MCG/2ML IJ SOLN
INTRAMUSCULAR | Status: DC | PRN
  Administered 2015-11-09 (×4): 50 ug via INTRAVENOUS

## 2015-11-09 MED ORDER — LEVOFLOXACIN 500 MG PO TABS: 500.0000 mg | ORAL_TABLET | Freq: Every day | ORAL | Status: DC

## 2015-11-09 MED ORDER — EPHEDRINE SULFATE 50 MG/ML IJ SOLN
INTRAMUSCULAR | Status: DC | PRN
  Administered 2015-11-09 (×3): 10 mg via INTRAVENOUS

## 2015-11-09 MED ORDER — MIDAZOLAM HCL 5 MG/5ML IJ SOLN
INTRAMUSCULAR | Status: DC | PRN
  Administered 2015-11-09: 2 mg via INTRAVENOUS

## 2015-11-09 MED ORDER — SODIUM CHLORIDE 0.9 % IR SOLN
Status: DC | PRN
  Administered 2015-11-09: 1000 mL via INTRAVESICAL

## 2015-11-09 MED ORDER — KETOROLAC TROMETHAMINE 30 MG/ML IJ SOLN
INTRAMUSCULAR | Status: DC | PRN
  Administered 2015-11-09: 30 mg via INTRAVENOUS

## 2015-11-09 MED ORDER — FLUORESCEIN SODIUM 10 % IV SOLN
INTRAVENOUS | Status: AC
  Filled 2015-11-09: qty 5

## 2015-11-09 MED ORDER — KETOROLAC TROMETHAMINE 30 MG/ML IJ SOLN
INTRAMUSCULAR | Status: AC
  Filled 2015-11-09: qty 1

## 2015-11-09 SURGICAL SUPPLY — 56 items
ALLOGRAFT TIS AXIS TUTPLST 4X7 (Mesh General) ×1 IMPLANT
BAG URINE DRAINAGE (UROLOGICAL SUPPLIES) IMPLANT
BLADE CLIPPER SURG (BLADE) IMPLANT
BLADE SURG 10 STRL SS (BLADE) ×2 IMPLANT
BLADE SURG 15 STRL LF DISP TIS (BLADE) ×1 IMPLANT
BLADE SURG 15 STRL SS (BLADE) ×1
BOOTIES KNEE HIGH SLOAN (MISCELLANEOUS) ×2 IMPLANT
CANISTER SUCTION 1200CC (MISCELLANEOUS) ×2 IMPLANT
CANISTER SUCTION 2500CC (MISCELLANEOUS) ×2 IMPLANT
CATH FOLEY 2WAY SLVR  5CC 16FR (CATHETERS) ×1
CATH FOLEY 2WAY SLVR 5CC 16FR (CATHETERS) ×1 IMPLANT
COVER BACK TABLE 60X90IN (DRAPES) ×2 IMPLANT
COVER MAYO STAND STRL (DRAPES) ×2 IMPLANT
DEVICE CAPIO SLIM BOX (INSTRUMENTS) IMPLANT
DISSECTOR ROUND CHERRY 3/8 STR (MISCELLANEOUS) IMPLANT
DRAPE UNDERBUTTOCKS STRL (DRAPE) ×2 IMPLANT
FLOSEAL 10ML (HEMOSTASIS) IMPLANT
GAUZE SPONGE 4X4 16PLY XRAY LF (GAUZE/BANDAGES/DRESSINGS) IMPLANT
GLOVE BIO SURGEON STRL SZ7.5 (GLOVE) ×2 IMPLANT
GOWN STRL REUS W/ TWL LRG LVL3 (GOWN DISPOSABLE) ×1 IMPLANT
GOWN STRL REUS W/ TWL XL LVL3 (GOWN DISPOSABLE) ×1 IMPLANT
GOWN STRL REUS W/TWL LRG LVL3 (GOWN DISPOSABLE) ×1
GOWN STRL REUS W/TWL XL LVL3 (GOWN DISPOSABLE) ×1
KIT ROOM TURNOVER WOR (KITS) ×2 IMPLANT
LIQUID BAND (GAUZE/BANDAGES/DRESSINGS) IMPLANT
NEEDLE 1/2 CIR CATGUT .05X1.09 (NEEDLE) ×2 IMPLANT
NEEDLE HYPO 22GX1.5 SAFETY (NEEDLE) ×2 IMPLANT
PACKING VAGINAL (PACKING) IMPLANT
PENCIL BUTTON HOLSTER BLD 10FT (ELECTRODE) ×2 IMPLANT
PLUG CATH AND CAP STER (CATHETERS) ×2 IMPLANT
RETRACTOR LONRSTAR 16.6X16.6CM (MISCELLANEOUS) ×1 IMPLANT
RETRACTOR STAY HOOK 5MM (MISCELLANEOUS) ×2 IMPLANT
RETRACTOR STER APS 16.6X16.6CM (MISCELLANEOUS) ×2
SET IRRIG Y TYPE TUR BLADDER L (SET/KITS/TRAYS/PACK) ×2 IMPLANT
SHEET LAVH (DRAPES) ×2 IMPLANT
SPONGE LAP 4X18 X RAY DECT (DISPOSABLE) ×2 IMPLANT
SUCTION FRAZIER HANDLE 10FR (MISCELLANEOUS) ×1
SUCTION TUBE FRAZIER 10FR DISP (MISCELLANEOUS) ×1 IMPLANT
SUT ABS MONO DBL WITH NDL 48IN (SUTURE) IMPLANT
SUT ETHILON 2 0 PS N (SUTURE) IMPLANT
SUT MON AB 2-0 SH 27 (SUTURE) ×3
SUT MON AB 2-0 SH27 (SUTURE) ×3 IMPLANT
SUT NONABSORB MONO DB W/NDL 48 (SUTURE) IMPLANT
SUT PDS AB 3-0 SH 27 (SUTURE) ×2 IMPLANT
SUT VIC AB 0 CT1 36 (SUTURE) IMPLANT
SUT VIC AB 2-0 CT1 27 (SUTURE)
SUT VIC AB 2-0 CT1 TAPERPNT 27 (SUTURE) IMPLANT
SUT VIC AB 2-0 UR6 27 (SUTURE) ×8 IMPLANT
SYR BULB IRRIGATION 50ML (SYRINGE) ×2 IMPLANT
SYR CONTROL 10ML LL (SYRINGE) ×2 IMPLANT
SYRINGE 10CC LL (SYRINGE) ×2 IMPLANT
TISSUE AXIS TUTOPLAST 4X7 (Mesh General) ×2 IMPLANT
TRAY DSU PREP LF (CUSTOM PROCEDURE TRAY) ×2 IMPLANT
TUBE CONNECTING 12X1/4 (SUCTIONS) ×4 IMPLANT
WATER STERILE IRR 500ML POUR (IV SOLUTION) ×2 IMPLANT
YANKAUER SUCT BULB TIP NO VENT (SUCTIONS) IMPLANT

## 2015-11-09 NOTE — Anesthesia Procedure Notes (Signed)
Procedure Name: LMA Insertion Date/Time: 11/09/2015 9:22 AM Performed by: Denna Haggard D Pre-anesthesia Checklist: Patient identified, Emergency Drugs available, Suction available and Patient being monitored Patient Re-evaluated:Patient Re-evaluated prior to inductionOxygen Delivery Method: Circle System Utilized Preoxygenation: Pre-oxygenation with 100% oxygen Intubation Type: IV induction Ventilation: Mask ventilation without difficulty LMA: LMA inserted LMA Size: 4.0 Number of attempts: 1 Airway Equipment and Method: Bite block Placement Confirmation: positive ETCO2 Tube secured with: Tape Dental Injury: Teeth and Oropharynx as per pre-operative assessment

## 2015-11-09 NOTE — Op Note (Signed)
Operative Note:  Pre-operative diagnosis: Extensive vaginal mesh erosion from prior transobturator mid-urethral mesh sling  Postoperative diagnosis: Same  Operation: Excision of extensive extrusion of  Vaginal TVT Periurethral Tape; Reconstruction of the bladder neck with  biologic graft material within vagina - Coloplast Axis Dermis.  Surgeon:  Carolan Clines, MD  Resident: E. Harvel Ricks, MD  Anesthesia: General LMA  anesthesia  Preparation: After appropriate preanesthesia, the patient was brought to the operating room, placed on the operating table in the dorsal supine position. She received peri-operative IV antibiotics. Timeout was observed. Armband was double-checked.   Indications: Please refer to H&P  Findings:  - Extensive extrusion of prior trans-obturator mid-urethral sling from high left vaginal fornix extending across the mid-urethra to the mid right vaginal fornix with extensive associated scarring - particularly in the left fornix and mid-urethra.  - All involved tissue and mesh was widely excised sharply. The sling was dissected off the urethra and encircled with a right angle and then divided in the midline and dissected out in its' entirety, deep into each lateral vaginal fornix, as far as possible. It was then sharply divided as proximally as possible, deep within each fornix. Palpation deep within each fornix did not reveal palpable mesh. - Mobilization of the anterior vaginal mucosa off the underlying bladder was performed making an inverted U-shaped flap which would be brought up to the bilateral vaginal fornices and mucosa overlying the mid-urethra to cover the gap left from the excised mesh and mucosa. - We performed a small amount of mobilization of the mucosa overlying the urethra and the bilateral fornices as well as the lateral walls of the anterior vagina to make vaginal epithelial closure easier. - A 7 cm x 4 cm piece of Coloplast Axis Dermis was trimmed  into a trapezium shape, with the upper edge shaped with a gentle curve to fit around the mid-urethra- and exaggerated bilateral upper edges to reach into each fornix. The narrower bottom portion was trimmed to fit under the inverted U-shaped flap. This graft was secured to the underlying tissue with (four) interrupted 3-0 vicryl sutures in each quadrant. These were tied down with excellent cosmetic effect and the graft lay in a flat position, with no wrinkles. - The inverted U-flap was then closed over the graft - Cystoscopy at the conclusion of the case demonstrated vigorous efflux of urine with Fluoroscein bilaterally and no evidence of any injury to the bladder or urethra.   Procedure: The patient was taken to the operating room and general LMA anesthesia was induced. She was placed in high dorsal lithotomy in the usual fashion making sure to pad all pressure points. She was prepped and draped in the standard fashion. Peri-operative antibiotics were administered and we performed a time out safety check.   We began with an exam under anesthesia. Inspection of vaginal mucosa at the beginning of the case demonstrated extensive extrusion of prior trans-obturator mid-urethral sling from the high left vaginal fornix, extending across the mid-urethra to the mid right vaginal fornix, with extensive associated scarring - particularly in the left fornix and mid-urethra.   Next, the [facility] retractor is placed, and foley catheter was placed. Using a blue marking pen we marked out our intended line of incision to excise all extruded mesh and involved tissue which was essentially a band the width of the mesh extending from right upper fornix across the mid-urethra to the left upper fornix.  Next, we injected a mixture of 0.5% marcaine and epinephrine,  1::200,000, into our intended area of mesh excision,  to perform hydrodissection. Next, we performed additional hydrodissection with local anesthesia between the  anterior vaginal mucosa and the underlying bladder for our future U-flap.   Using a 15 blade we incised along our marked out line in order to widely excise all involved tissue and mesh sharply. Next, the sling was bluntly dissected off the urethra with a right angle and ultimately encircled with a right angle making sure not to injure the underlying urethra. Next, we then divided the sling sharply in the midline and proceeded to sharply dissected out each side of the sling an involved mucosa. We did so by dissecting each piece of mesh in its entirety deep into each lateral vaginal fornix as far as possible making sure not to injure the urethra or the bladder. This was repeated on the second side. Each piece of the mesh was then sharply divided as proximally as possible deep within each fornix with heavy scissors. Palpation deep within each fornix did not reveal palpable mesh. There was no evidence of injury to the urethra or bladder.   Next, we marked out our intended incision laterally on the anterior vaginal mucosa to create our inverted U-shaped flap. Mobilization of the anterior vaginal mucosa off the underlying pubocervical fascia was performed making a inverted U-shaped flap with a combination of sharp and blunt dissection without evidence of injury to the bladder. This was dissected proximally almost to the vaginal cuff to provide a flap with sufficient length to be brought distally and sutured to the bilateral vaginal fornices and mucosa overlying the mid-urethra to cover the gap left from the excised mesh and mucosa. Next, we performed a small amount of mobilization of the mucosa overlying the urethra, the urethrovesical angle and the bilateral fornices as well as the lateral walls of the anterior vagina to make skin closure easier with a combination of sharp and blunt dissection.  Next, we obtained a piece of 7 cm x 4 cm piece of Coloplast Axis Dermis to be placed in the defect where the extruded  mesh had been present. The dermis was trimmed with a 15 blade scalpel into a trapezium with the upper edge with a gentle curve to fit around the mid-urethra and exaggerated bilateral upper edges to reach into each fornix. The narrower bottom portion was trimmed to fit under the inverted U-shaped flap of anterior vaginal mucosa.     When the graft was tested, and was the correct size, and sat flat,  we  preplaced four interrupted 3-0 vicryls on a UR6 needle proximally, distally and laterally - on in each fornix. We then passed through the graft in each corresponding area and  utilized a free needle to pass the second end of each suture through the graft as well so it would lie flat when we sutured it in place. These were tied down with excellent cosmetic effect and the graft sat in a flat position and was tension free.  Next, we trimmed a small amount of chronically inflamed anterior vaginal mucosa and closed the anterior vaginal wall in an inverted U shape with several running 3-0 Monocryl sutures. She had excellent vaginal length.    Cystoscopy showed no distortion of the anatomy or injury to the bladder or urethra. She had excellent yellow jets bilaterally.  Following cystoscopy, Foley catheter was replaced.  Vaginal packing was placed using estrogen and Vaginal Pack, and Foley was left to straight drainage. The patient received IV Toradol, was  awakened, and taken to recovery room in good condition.             Attestation: Dr. Gaynelle Arabian was present and scrubbed for the entirety of the procedure.

## 2015-11-09 NOTE — Interval H&P Note (Signed)
History and Physical Interval Note:  11/09/2015 9:10 AM  Heather Liu  has presented today for surgery, with the diagnosis of VAGINAL MESH EROSION  The various methods of treatment have been discussed with the patient and family. After consideration of risks, benefits and other options for treatment, the patient has consented to  Procedure(s): *REMOVE OF TVT VAGINAL TAPE AND REPAIR WITH COLOPLAST AXIS DERMIS** (N/A) REPAIR WITH COLOPLAST AXIS DERMIS** (N/A) as a surgical intervention .  The patient's history has been reviewed, patient examined, no change in status, stable for surgery.  I have reviewed the patient's chart and labs.  Questions were answered to the patient's satisfaction.     [name] I [name]

## 2015-11-09 NOTE — Transfer of Care (Signed)
Immediate Anesthesia Transfer of Care Note  Patient: Heather Liu  Procedure(s) Performed: Procedure(s) (LRB): *REMOVE OF TVT VAGINAL TAPE AND REPAIR WITH COLOPLAST AXIS DERMIS** (N/A) REPAIR WITH COLOPLAST AXIS DERMIS** (N/A)  Patient Location: PACU  Anesthesia Type: General  Level of Consciousness: awake, oriented, sedated and patient cooperative  Airway & Oxygen Therapy: Patient Spontanous Breathing and Patient connected to face mask oxygen  Post-op Assessment: Report given to PACU RN and Post -op Vital signs reviewed and stable  Post vital signs: Reviewed and stable  Complications: No apparent anesthesia complications  Last Vitals:  Filed Vitals:   11/09/15 0732 11/09/15 1209  BP: 148/70 134/71  Pulse: 58 66  Temp: 36.7 C   Resp: 16 12

## 2015-11-09 NOTE — Discharge Instructions (Signed)
As discussed with Dr. Gaynelle Arabian pre-operatively.  You may resume aspirin, advil, aleve, vitamins, and supplements 7 days after surgery.  Activity:  You are encouraged to ambulate frequently (about every hour during waking hours) to help prevent blood clots from forming in your legs or lungs.  However, you should not engage in any heavy lifting (> 10-15 lbs), strenuous activity, or straining for 6 weeks. It is OK to start driving when you are off narcotic pain medications. No intercourse or placing anything within the vagina for 6 weeks.   Diet: You may resume a regular diet.  Prescriptions: Complete the course of antibiotics (Bactrim) which was prescribed to you. You will be provided a prescription for pain medication to take as needed.  If your pain is not severe enough to require the prescription pain medication, you may take extra strength Tylenol instead which will have less side effects.  You should also take an over the counter stool softener to avoid straining with bowel movements as the prescription pain medication may constipate you. Do not drive while taking narcotic pain medications. Take Colace and/or Senna while taking narcotic pain medication. You may take over-the-counter Laxatives such as Miralax, Senna, or Milk of Magnesia. Stop taking these medications if you develop diarrhea.  You may start showering (but not soaking or bathing in water) the 2nd day after surgery and the incisions simply need to be patted dry after the shower. No baths or submerging for 2 weeks. Do not scrub over the incisions, simply let soap and water run over them.  Do not soak the incision (such as a bath) for 2 weeks after surgery.  No additional care is needed.  What to call us about: You should call the office [phone]) if you develop fever > 101, significant bleeding, develop persistent vomiting.  Resume home medications as prescribed by your primary care physician.  It is normal to have some vaginal  spotting and bleeding for 1-2 weeks following this surgery.  If you are unable to urinate please call Dr. Arlyn Leak office.  Foley catheter: You have been discharged with an indwelling foley catheter. This will be removed tomorrow by one of the nurses in Dr. Arlyn Leak clinic. Please call the clinic to confirm your appointment details. Care for your foley catheter as instructed by the nurse in the recovery room.  Post Anesthesia Home Care Instructions  Activity: Get plenty of rest for the remainder of the day. A responsible adult should stay with you for 24 hours following the procedure.  For the next 24 hours, DO NOT: -Drive a car -Paediatric nurse -Drink alcoholic beverages -Take any medication unless instructed by your physician -Make any legal decisions or sign important papers.  Meals: Start with liquid foods such as gelatin or soup. Progress to regular foods as tolerated. Avoid greasy, spicy, heavy foods. If nausea and/or vomiting occur, drink only clear liquids until the nausea and/or vomiting subsides. Call your physician if vomiting continues.  Special Instructions/Symptoms: Your throat may feel dry or sore from the anesthesia or the breathing tube placed in your throat during surgery. If this causes discomfort, gargle with warm salt water. The discomfort should disappear within 24 hours.  If you had a scopolamine patch placed behind your ear for the management of post- operative nausea and/or vomiting:  1. The medication in the patch is effective for 72 hours, after which it should be removed.  Wrap patch in a tissue and discard in the trash. Wash hands thoroughly with soap and  water. 2. You may remove the patch earlier than 72 hours if you experience unpleasant side effects which may include dry mouth, dizziness or visual disturbances. 3. Avoid touching the patch. Wash your hands with soap and water after contact with the patch.   HOME CARE INSTRUCTIONS FOR VAGINAL  SLING  Activity:  -No lifting greater than 10-15 pounds for 1 week, or as instructed by your                        physician.             -No sexual intercourse until your f/u visit Diet:  You may return to your normal diet tomorrow.   It is important to keep your       bowels regular during the postoperative period.  To avoid constipation, drink plenty of fluids during the day (8-10 glasses) and eat plenty of fresh fruits and vegetables.  Use a mild laxative or stool softener if necessary.  Wound Care:  You may begin showering tomorrow, or as instructed by your physician.      Return to Work as instructed by your physician.    Special Instructions:   Call your physician if any of these symptoms occur:   -temperature greater than 101 degrees Farenheit.   -redness, swelling or drainage at incision site.   -foul odor of your urine.   -a significant decrease in the amount of urine you have every day.   -severe pain not relieved by your pain medication.    Return to see your doctor .   Call to set up a follow-up appointment.   Patient Signature:  ________________________________________________________  Nurse's Signature:  ________________________________________________________HOME CARE INSTRUCTIONS FOR VAGINAL SLING  Activity:  -No lifting greater than 10-15 pounds for 1 week, or as instructed by your                        physician.             -No sexual intercourse until your f/u visit Diet:  You may return to your normal diet tomorrow.   It is important to keep your       bowels regular during the postoperative period.  To avoid constipation, drink plenty of fluids during the day (8-10 glasses) and eat plenty of fresh fruits and vegetables.  Use a mild laxative or stool softener if necessary.  Wound Care:  You may begin showering tomorrow, or as instructed by your physician.      Return to Work as instructed by your physician.    Special Instructions:   Call your  physician if any of these symptoms occur:   -temperature greater than 101 degrees Farenheit.   -redness, swelling or drainage at incision site.   -foul odor of your urine.   -a significant decrease in the amount of urine you have every day.   -severe pain not relieved by your pain medication.    Return to see your doctor .   Call to set up a follow-up appointment.   Patient Signature:  ________________________________________________________  Nurse's Signature:  ________________________________________________________

## 2015-11-09 NOTE — Anesthesia Preprocedure Evaluation (Addendum)
Anesthesia Evaluation  Patient identified by MRN, date of birth, ID band Patient awake    Reviewed: Allergy & Precautions, NPO status , Patient's Chart, lab work & pertinent test results  Airway Mallampati: II  TM Distance: >3 FB Neck ROM: Full    Dental   Pulmonary Current Smoker,    breath sounds clear to auscultation       Cardiovascular hypertension,  Rhythm:Regular Rate:Normal     Neuro/Psych    GI/Hepatic negative GI ROS, Neg liver ROS,   Endo/Other  negative endocrine ROS  Renal/GU negative Renal ROS     Musculoskeletal   Abdominal   Peds  Hematology   Anesthesia Other Findings   Reproductive/Obstetrics                            Anesthesia Physical Anesthesia Plan  ASA: III  Anesthesia Plan: General   Post-op Pain Management:    Induction: Intravenous  Airway Management Planned: LMA  Additional Equipment:   Intra-op Plan:   Post-operative Plan: Extubation in OR  Informed Consent: I have reviewed the patients History and Physical, chart, labs and discussed the procedure including the risks, benefits and alternatives for the proposed anesthesia with the patient or authorized representative who has indicated his/her understanding and acceptance.   Dental advisory given  Plan Discussed with: CRNA and Anesthesiologist  Anesthesia Plan Comments:         Anesthesia Quick Evaluation

## 2015-11-09 NOTE — Anesthesia Postprocedure Evaluation (Signed)
Anesthesia Post Note  Patient: Heather Liu  Procedure(s) Performed: Procedure(s) (LRB): *REMOVE OF TVT VAGINAL TAPE AND REPAIR WITH COLOPLAST AXIS DERMIS** (N/A) REPAIR WITH COLOPLAST AXIS DERMIS** (N/A)  Patient location during evaluation: PACU Anesthesia Type: General Level of consciousness: sedated Pain management: pain level controlled Vital Signs Assessment: post-procedure vital signs reviewed and stable Respiratory status: spontaneous breathing and respiratory function stable Cardiovascular status: stable Anesthetic complications: no    Last Vitals:  Filed Vitals:   11/09/15 1315 11/09/15 1404  BP: 111/69 110/53  Pulse: 58 64  Temp:  36.8 C  Resp: 14 16    Last Pain:  Filed Vitals:   11/09/15 1404  PainSc: 0-No pain                 Aniston Christman DANIEL

## 2015-11-10 ENCOUNTER — Encounter (HOSPITAL_BASED_OUTPATIENT_CLINIC_OR_DEPARTMENT_OTHER): Payer: Self-pay | Admitting: Urology

## 2015-11-15 NOTE — H&P (Signed)
Reason For Visit F/u to review urodynamic results   Active Problems Problems  1. Erosion of vaginal mesh, initial encounter (T83.711A) 2. Erosion of vaginal mesh, subsequent encounter (T83.711D) 3. Urge and stress incontinence (N39.46)  History of Present Illness     50 yo female returns today to review urodynamic results. Originally referred by Dr. Philis Pique for pelvic pain. She has history of uterine prolapse, and underwent vaginal hysterectomy in September 2015, with associated bladder fall repair, and urethral sling for stress urinary incontinence with a Gynecare TVT tape. Following her hysterectomy, the patient's husband noted that he always felt a "fishing line" across the vaginal opening, during sexual activity. The patient also noted postcoital vaginal pain. Pelvic examination found the area of pain, and the patient has also felt the pain on the left side of the vagina and in her lower back. She noted new onset of incontinence 5 months ago, with cough with an sneeze incontinence. She also notes urgency and urge incontinence. She has a tobacco history of 35 pack years. She is para 1-2-0.   Physical examination show patient has a BMI of 26, with a height of 5 foot 6 and weight of 162 pounds. Urinalysis is negative.    Urodynamics on 08/23/15, shows a cystometric capacity of 460 cc. First sensation occurs at 59 cc, with strong desire at 168 cc. There is no obvious instability noted. Valsalva leak point pressure is evaluated, and the patient did not leak for Valsalva leak point determination at abdominal pressure generation of 120 cm of water pressure.    Pressure flow studies accomplished, and a voluntary contraction was generated with maximum flow rate of 19 cc/s with a detrusor pressure at maximum flow of 8 cm water pressure. PV R is 30-40 cc. EMGs increased during voiding due to poor relaxation of the external sphincter. The bladder neck dissections 1 cm or less. Cystogram was accomplished,  shows no bony abnormalities, and no reflux.    The patient has a maximum capacity of 460 cc. There is no stress incontinence noted during the study, with reasonable abdominal pressures generated. There is no obvious instability noted.   Past Medical History Problems  1. History of Anxiety (F41.9) 2. History of depression (Z86.59) 3. History of hypertension (Z86.79) 4. History of malignant melanoma [id number]  Surgical History Problems  1. History of Hysterectomy  Current Meds 1. Estrace 0.1 MG/GM Vaginal Cream; APPLY/RUB 1/2 INCH TO VAGINAL OPENING &  URETHRAL AREA;  Therapy: [phone] to (Last Rx:22Feb2017)  Requested for: 22Feb2017 Ordered 2. Micardis 80 MG Oral Tablet;  Therapy: (Recorded:22Feb2017) to Recorded 3. Multi Vitamin/Minerals TABS;  Therapy: (Recorded:22Feb2017) to Recorded 4. Omega 3 CAPS;  Therapy: (Recorded:22Feb2017) to Recorded 5. Toprol XL 200 MG TBCR;  Therapy: (Recorded:22Feb2017) to Recorded  Allergies Medication  1. Sulfa Drugs  Family History Problems  1. Family history of pancreatic cancer (Z80.0) : Father  Social History Problems  1. Denied: History of Alcohol use 2. Caffeine use (F15.90) 3. Current smoker (F17.200) 4. Married  Review of Systems Genitourinary, constitutional, skin, eye, otolaryngeal, hematologic/lymphatic, cardiovascular, pulmonary, endocrine, musculoskeletal, gastrointestinal, neurological and psychiatric system(s) were reviewed and pertinent findings if present are noted and are otherwise negative.  Genitourinary: nocturia, incontinence, hematuria, pelvic pain and dyspareunia.  Gastrointestinal: nausea and heartburn.  Constitutional: night sweats.  Psychiatric: anxiety.    Vitals Vital Signs [Data Includes: Last 1 Day]  Recorded: 21Mar2017 03:51PM  Blood Pressure: 125 / 75 Temperature: 98.6 F Heart Rate: 60  Physical Exam ENT:. The  ears and nose are normal in appearance.  Neck: The appearance of the neck is  normal.  Pulmonary: No respiratory distress and normal respiratory rhythm and effort.  Cardiovascular:. No peripheral edema.  Abdomen: The abdomen is flat. The abdomen is normal to percussion.  Genitourinary:  Chaperone Present: kim lewis.  Examination of the external genitalia shows no vulvar atrophy, no condyloma acuminatum and no labial adhesions. The urethra is tender, but does not appear stenotic and no urethral caruncle. There is no urethral mass. Urethral hypermobility is not present. There is no urethral discharge. No periurethral cyst is identified. There is no urethral prolapse. Vaginal extrusion of Gynecare TVT tape left greater than right side. Tender to palpation. Vaginal exam demonstrates tenderness and the vaginal epithelium to be poorly estrogenized, but no abnormalities, no atrophy and no discharge. No cystocele is identified. No enterocele is identified. No rectocele is identified. There is no evidence of a vesicovaginal fistula. The cervix is is absent. The uterus is absent, but non tender. The bladder is non tender, not distended and without masses. The anus is normal on inspection.  Lymphatics: The femoral and inguinal nodes are not enlarged or tender.  Skin: Normal skin turgor, no visible rash and no visible skin lesions.  Neuro/Psych:. Mood and affect are appropriate.    Results/Data Urine [Data Includes: Last 1 Day]   [id number]  COLOR YELLOW   APPEARANCE CLEAR   SPECIFIC GRAVITY <1.005   pH 7.0   GLUCOSE NEGATIVE   BILIRUBIN NEGATIVE   KETONE NEGATIVE   BLOOD NEGATIVE   PROTEIN NEGATIVE   NITRITE NEGATIVE   LEUKOCYTE ESTERASE NEGATIVE    Assessment Assessed  1. Urge and stress incontinence (N39.46) 2. Erosion of vaginal mesh, subsequent encounter (T71.79D)  50 year old female with extrusion of Gynecare TVT tape. The patient is symptomatic, and will need removal of the tape. She will have implantation of Coloplast axis dermis as covering. I think this can be  done as an outpatient, with (?) no overnight packing and  no catheter.    This will be accomplished at [facility] in April. We have reviewed her urodynamics, and that she has a stable bladder. I do not think she will need to have additional stress urinary incontinence surgery. I will try to remove approximately 80% of the TVT tape, or as much as I can get out as possible.   Plan Repair vaginal extrusion. Pt will need excision of tape and possible implantation of Coloplast tissue..  cc: Bobbye Charleston, MD     Signatures

## 2015-12-12 DIAGNOSIS — T83711A Erosion of implanted vaginal mesh and other prosthetic materials to surrounding organ or tissue, initial encounter: Secondary | ICD-10-CM | POA: Diagnosis not present

## 2016-01-26 DIAGNOSIS — N393 Stress incontinence (female) (male): Secondary | ICD-10-CM | POA: Diagnosis not present

## 2016-01-31 DIAGNOSIS — R202 Paresthesia of skin: Secondary | ICD-10-CM | POA: Diagnosis not present

## 2016-01-31 DIAGNOSIS — M722 Plantar fascial fibromatosis: Secondary | ICD-10-CM | POA: Diagnosis not present

## 2016-01-31 DIAGNOSIS — M79661 Pain in right lower leg: Secondary | ICD-10-CM | POA: Diagnosis not present

## 2016-02-01 DIAGNOSIS — D225 Melanocytic nevi of trunk: Secondary | ICD-10-CM | POA: Diagnosis not present

## 2016-02-01 DIAGNOSIS — Z85820 Personal history of malignant melanoma of skin: Secondary | ICD-10-CM | POA: Diagnosis not present

## 2016-02-01 DIAGNOSIS — D2272 Melanocytic nevi of left lower limb, including hip: Secondary | ICD-10-CM | POA: Diagnosis not present

## 2016-02-01 DIAGNOSIS — L821 Other seborrheic keratosis: Secondary | ICD-10-CM | POA: Diagnosis not present

## 2016-06-21 DIAGNOSIS — I119 Hypertensive heart disease without heart failure: Secondary | ICD-10-CM | POA: Diagnosis not present

## 2016-06-21 DIAGNOSIS — E785 Hyperlipidemia, unspecified: Secondary | ICD-10-CM | POA: Diagnosis not present

## 2016-08-14 DIAGNOSIS — Z6826 Body mass index (BMI) 26.0-26.9, adult: Secondary | ICD-10-CM | POA: Diagnosis not present

## 2016-08-14 DIAGNOSIS — K219 Gastro-esophageal reflux disease without esophagitis: Secondary | ICD-10-CM | POA: Diagnosis not present

## 2016-08-14 DIAGNOSIS — R05 Cough: Secondary | ICD-10-CM | POA: Diagnosis not present

## 2016-08-14 DIAGNOSIS — R490 Dysphonia: Secondary | ICD-10-CM | POA: Diagnosis not present

## 2016-08-14 DIAGNOSIS — Z1389 Encounter for screening for other disorder: Secondary | ICD-10-CM | POA: Diagnosis not present

## 2016-09-24 DIAGNOSIS — R32 Unspecified urinary incontinence: Secondary | ICD-10-CM | POA: Diagnosis not present

## 2016-09-24 DIAGNOSIS — N393 Stress incontinence (female) (male): Secondary | ICD-10-CM | POA: Diagnosis not present

## 2016-09-24 DIAGNOSIS — N3941 Urge incontinence: Secondary | ICD-10-CM | POA: Diagnosis not present

## 2016-10-01 DIAGNOSIS — R278 Other lack of coordination: Secondary | ICD-10-CM | POA: Diagnosis not present

## 2016-10-01 DIAGNOSIS — M6281 Muscle weakness (generalized): Secondary | ICD-10-CM | POA: Diagnosis not present

## 2016-10-01 DIAGNOSIS — N9411 Superficial (introital) dyspareunia: Secondary | ICD-10-CM | POA: Diagnosis not present

## 2016-10-01 DIAGNOSIS — M62838 Other muscle spasm: Secondary | ICD-10-CM | POA: Diagnosis not present

## 2016-10-11 DIAGNOSIS — M6281 Muscle weakness (generalized): Secondary | ICD-10-CM | POA: Diagnosis not present

## 2016-10-11 DIAGNOSIS — M62838 Other muscle spasm: Secondary | ICD-10-CM | POA: Diagnosis not present

## 2016-10-11 DIAGNOSIS — N9411 Superficial (introital) dyspareunia: Secondary | ICD-10-CM | POA: Diagnosis not present

## 2016-10-11 DIAGNOSIS — N3946 Mixed incontinence: Secondary | ICD-10-CM | POA: Diagnosis not present

## 2016-10-29 DIAGNOSIS — J020 Streptococcal pharyngitis: Secondary | ICD-10-CM | POA: Diagnosis not present

## 2016-12-04 DIAGNOSIS — N3941 Urge incontinence: Secondary | ICD-10-CM | POA: Diagnosis not present

## 2016-12-04 DIAGNOSIS — M6281 Muscle weakness (generalized): Secondary | ICD-10-CM | POA: Diagnosis not present

## 2016-12-04 DIAGNOSIS — M62838 Other muscle spasm: Secondary | ICD-10-CM | POA: Diagnosis not present

## 2016-12-04 DIAGNOSIS — N393 Stress incontinence (female) (male): Secondary | ICD-10-CM | POA: Diagnosis not present

## 2016-12-10 DIAGNOSIS — N9411 Superficial (introital) dyspareunia: Secondary | ICD-10-CM | POA: Diagnosis not present

## 2016-12-10 DIAGNOSIS — N3946 Mixed incontinence: Secondary | ICD-10-CM | POA: Diagnosis not present

## 2016-12-10 DIAGNOSIS — M62838 Other muscle spasm: Secondary | ICD-10-CM | POA: Diagnosis not present

## 2016-12-10 DIAGNOSIS — M6281 Muscle weakness (generalized): Secondary | ICD-10-CM | POA: Diagnosis not present

## 2017-02-13 DIAGNOSIS — R05 Cough: Secondary | ICD-10-CM | POA: Diagnosis not present

## 2017-02-13 DIAGNOSIS — R51 Headache: Secondary | ICD-10-CM | POA: Diagnosis not present

## 2017-02-13 DIAGNOSIS — Z6826 Body mass index (BMI) 26.0-26.9, adult: Secondary | ICD-10-CM | POA: Diagnosis not present

## 2017-02-20 DIAGNOSIS — H04123 Dry eye syndrome of bilateral lacrimal glands: Secondary | ICD-10-CM | POA: Diagnosis not present

## 2017-03-18 DIAGNOSIS — J069 Acute upper respiratory infection, unspecified: Secondary | ICD-10-CM | POA: Diagnosis not present

## 2017-03-18 DIAGNOSIS — M545 Low back pain: Secondary | ICD-10-CM | POA: Diagnosis not present

## 2017-03-18 DIAGNOSIS — Z6825 Body mass index (BMI) 25.0-25.9, adult: Secondary | ICD-10-CM | POA: Diagnosis not present

## 2017-03-31 ENCOUNTER — Ambulatory Visit
Admission: RE | Admit: 2017-03-31 | Discharge: 2017-03-31 | Disposition: A | Discharge status: Home / Self Care | Payer: Federal, State, Local not specified - PPO | Source: Ambulatory Visit | Attending: Physician Assistant | Admitting: Physician Assistant

## 2017-03-31 ENCOUNTER — Other Ambulatory Visit: Payer: Self-pay | Admitting: Nurse Practitioner

## 2017-03-31 ENCOUNTER — Ambulatory Visit
Admission: RE | Admit: 2017-03-31 | Discharge: 2017-03-31 | Disposition: A | Discharge status: Home / Self Care | Payer: Federal, State, Local not specified - PPO | Source: Ambulatory Visit | Attending: Nurse Practitioner | Admitting: Nurse Practitioner

## 2017-03-31 ENCOUNTER — Other Ambulatory Visit: Payer: Self-pay | Admitting: Physician Assistant

## 2017-03-31 DIAGNOSIS — M545 Low back pain, unspecified: Secondary | ICD-10-CM

## 2017-03-31 DIAGNOSIS — R05 Cough: Secondary | ICD-10-CM

## 2017-03-31 DIAGNOSIS — R053 Chronic cough: Secondary | ICD-10-CM

## 2017-03-31 DIAGNOSIS — R918 Other nonspecific abnormal finding of lung field: Secondary | ICD-10-CM | POA: Diagnosis not present

## 2017-03-31 DIAGNOSIS — M47816 Spondylosis without myelopathy or radiculopathy, lumbar region: Secondary | ICD-10-CM | POA: Diagnosis not present

## 2017-04-09 DIAGNOSIS — M5136 Other intervertebral disc degeneration, lumbar region: Secondary | ICD-10-CM | POA: Diagnosis not present

## 2017-04-09 DIAGNOSIS — M47816 Spondylosis without myelopathy or radiculopathy, lumbar region: Secondary | ICD-10-CM | POA: Diagnosis not present

## 2017-05-08 DIAGNOSIS — M47816 Spondylosis without myelopathy or radiculopathy, lumbar region: Secondary | ICD-10-CM | POA: Diagnosis not present

## 2017-06-23 DIAGNOSIS — E7849 Other hyperlipidemia: Secondary | ICD-10-CM | POA: Diagnosis not present

## 2017-06-23 DIAGNOSIS — I119 Hypertensive heart disease without heart failure: Secondary | ICD-10-CM | POA: Diagnosis not present

## 2018-02-09 DIAGNOSIS — H43313 Vitreous membranes and strands, bilateral: Secondary | ICD-10-CM | POA: Diagnosis not present

## 2018-05-20 ENCOUNTER — Other Ambulatory Visit: Payer: Self-pay | Admitting: Cardiology

## 2018-05-20 MED ORDER — TELMISARTAN 80 MG PO TABS: 80.0000 mg | ORAL_TABLET | Freq: Every day | ORAL | 0 refills | Status: DC

## 2018-05-20 MED ORDER — METOPROLOL SUCCINATE ER 25 MG PO TB24: 25.0000 mg | ORAL_TABLET | Freq: Every day | ORAL | 0 refills | Status: DC

## 2018-05-20 NOTE — Telephone Encounter (Signed)
LAM for patient to call back to schedule appt   1. Which medications need to be refilled? (please list name of each medication and dose if known) metoprolol aucc ER 25mg  one a day; telmisartan 80mg  one daily  2. Which pharmacy/location (including street and city if local pharmacy) is medication to be sent to?[facility]  3. Do they need a 30 day or 90 day supply? [place]

## 2018-05-20 NOTE — Telephone Encounter (Signed)
Refills sent to [place] as requested. Patient is scheduled for an appointment with Dr. Bettina Gavia on 06/08/18.

## 2018-06-05 ENCOUNTER — Ambulatory Visit: Payer: Federal, State, Local not specified - PPO | Admitting: Cardiology

## 2018-06-07 DIAGNOSIS — I10 Essential (primary) hypertension: Secondary | ICD-10-CM | POA: Insufficient documentation

## 2018-06-07 HISTORY — DX: Essential (primary) hypertension: I10

## 2018-06-07 NOTE — Progress Notes (Signed)
Cardiology Office Note:    Date:  06/08/2018   ID:  Heather Liu, DOB 1966-04-24, MRN [id number]  PCP:  Leonides Sake, MD  Cardiologist:  Shirlee More, MD    Referring MD: Leonides Sake, MD    ASSESSMENT:    1. Family history of abdominal aortic aneurysm   2. Essential hypertension   3. Pure hypercholesterolemia   4. Current every day smoker    PLAN:    In order of problems listed above:  1. Her sister died age 53 after surgical intervention for complicated thoracic and abdominal aortic aneurysm obviously had an aortopathy and with her history of hypertension cigarette smoking family history of benefit from an ordered screening duplex abdominal aortic aneurysm. 2. Stable BP in the office after rest 140/80 continue current treatment sodium restriction ARB beta-blocker check renal function potassium.  Her EKG shows no evidence of endorgan damage 3. Discussion in the office benefits of statins she is hesitant she has good we will recheck her lipid profile today and I offered to give her the lowest dose of lowest intensity statin and hopefully she will consider 4. Unfortunately continues to smoke is not ready and not committed to cessation.   Next appointment: 1 yr   Medication Adjustments/Labs and Tests Ordered: Current medicines are reviewed at length with the patient today.  Concerns regarding medicines are outlined above.  Orders Placed This Encounter  Procedures  . EKG 12-Lead   No orders of the defined types were placed in this encounter.   Chief Complaint  Patient presents with  . Follow-up  . Hypertension  . Hyperlipidemia    History of Present Illness:    Heather Liu is a 52 y.o. female with a hx of hypertension and hyperlipidemia last seen by Dr. Wynonia Lawman 06/23/2017 review of charts she has had no previous cardiac diagnostic testing her medications consist of telmisartan and metoprolol along with over-the-counter fish oil.  Most recent lipid  profile 06/23/17 shows a cholesterol of 232 LDL 134 HDL 39 at that time she was recommended to initiate lipid-lowering therapy with a statin with her increased cardiovascular risk of hypertension. Compliance with diet, lifestyle and medications: Yes  Home BP is checked sporadically and is in range.  Continues to smoke and is not committed and ready for cessation.  She understands the health risk of statins with a nice discussion about side effects and benefit and said she would consider after rechecking lipid profile today I offered her the lowest dose of least intense statin to mitigate muscle symptoms.  She has had no diagnosis of but no screening for abdominal aortic aneurysm her sister died at age 12 of complex thoracic abdominal aortic aneurysm obviously had an aortopathy she is at risk with age hypertension smoking family history and is ordered today duplex for screening.  Greater than 25 minutes spent reviewing history hypertension treatment goals benefits of lipid-lowering therapy and rationale for screening for aortopathy.  Past Medical History:  Diagnosis Date  . Erosion of vaginal mesh ([place]) 2017   sling placed 2015  . Hyperlipidemia   . Hypertension     Past Surgical History:  Procedure Laterality Date  . ANTERIOR AND POSTERIOR REPAIR N/A 02/21/2014   Procedure: ANTERIOR (CYSTOCELE) REPAIR ;  Surgeon: Sanjuana Kava, MD;  Location: [facility];  Service: Gynecology;  Laterality: N/A;  . BLADDER SUSPENSION N/A 02/21/2014   Procedure: TRANSVAGINAL TAPE (TVT) PROCEDURE WITH CYSTO;  Surgeon: Sanjuana Kava, MD;  Location: [facility];  Service: Gynecology;  Laterality: N/A;  . BLADDER SUSPENSION N/A 11/09/2015   Procedure: REPAIR WITH COLOPLAST AXIS DERMIS**;  Surgeon: Carolan Clines, MD;  Location: [facility];  Service: Urology;  Laterality: N/A;  . DILATION AND CURETTAGE OF UTERUS     SAB  . TRANSVAGINAL TAPE (TVT) REMOVAL N/A 11/09/2015   Procedure: *REMOVE OF TVT VAGINAL TAPE AND  REPAIR WITH COLOPLAST AXIS DERMIS**;  Surgeon: Carolan Clines, MD;  Location: [facility];  Service: Urology;  Laterality: N/A;  . VAGINAL HYSTERECTOMY N/A 02/21/2014   Procedure: HYSTERECTOMY VAGINAL;  Surgeon: Sanjuana Kava, MD;  Location: [facility];  Service: Gynecology;  Laterality: N/A;    Current Medications: Current Meds  Medication Sig  . Cholecalciferol (VITAMIN D-3) 5000 UNITS TABS Take 1 capsule by mouth daily.   . metoprolol succinate (TOPROL-XL) 25 MG 24 hr tablet Take 1 tablet (25 mg total) by mouth daily.  . Multiple Vitamins-Minerals (MULTIVITAMIN PO) Take 2 tablets by mouth daily.  . NON FORMULARY Take 1 tablet by mouth daily. Prolent  . telmisartan (MICARDIS) 80 MG tablet Take 1 tablet (80 mg total) by mouth daily.     Allergies:   Codeine; Morphine and related; and Sulfa antibiotics   Social History   Socioeconomic History  . Marital status: Married    Spouse name: Not on file  . Number of children: Not on file  . Years of education: Not on file  . Highest education level: Not on file  Occupational History  . Not on file  Social Needs  . Financial resource strain: Not on file  . Food insecurity:    Worry: Not on file    Inability: Not on file  . Transportation needs:    Medical: Not on file    Non-medical: Not on file  Tobacco Use  . Smoking status: Current Every Day Smoker    Packs/day: 1.00    Years: 35.00    Pack years: 35.00    Types: Cigarettes  . Smokeless tobacco: Never Used  Substance and Sexual Activity  . Alcohol use: No  . Drug use: No  . Sexual activity: Not on file  Lifestyle  . Physical activity:    Days per week: Not on file    Minutes per session: Not on file  . Stress: Not on file  Relationships  . Social connections:    Talks on phone: Not on file    Gets together: Not on file    Attends religious service: Not on file    Active member of club or organization: Not on file    Attends meetings of clubs or  organizations: Not on file    Relationship status: Not on file  Other Topics Concern  . Not on file  Social History Narrative  . Not on file     Family History: The patient's family history includes Heart attack in her paternal grandfather and sister; Hypertension in her father and mother; Pancreatic cancer in her father; Stroke in her maternal aunt, maternal grandmother, and maternal uncle. ROS:   Please see the history of present illness.    All other systems reviewed and are negative.  EKGs/Labs/Other Studies Reviewed:    The following studies were reviewed today:  EKG:  EKG ordered today.  The ekg ordered today demonstrates [place] normal  Recent Labs: No results found for requested labs within last 8760 hours.  Recent Lipid Panel No results found for: CHOL, TRIG, HDL, CHOLHDL, VLDL, LDLCALC, LDLDIRECT  Physical Exam:    VS:  BP 140/80 (BP Location: Right Arm, Patient Position: Sitting, Cuff Size: Normal)   Pulse (!) 58   Ht 5\' 6"  (1.676 m)   Wt 162 lb 6.4 oz (73.7 kg)   SpO2 96%   BMI 26.21 kg/m     Wt Readings from Last 3 Encounters:  06/08/18 162 lb 6.4 oz (73.7 kg)  11/09/15 158 lb 8 oz (71.9 kg)  02/16/14 147 lb (66.7 kg)     GEN:  Well nourished, well developed in no acute distress HEENT: Normal NECK: No JVD; No carotid bruits LYMPHATICS: No lymphadenopathy CARDIAC: RRR, no murmurs, rubs, gallops RESPIRATORY:  Clear to auscultation without rales, wheezing or rhonchi  ABDOMEN: Soft, non-tender, non-distended MUSCULOSKELETAL:  No edema; No deformity  SKIN: Warm and dry NEUROLOGIC:  Alert and oriented x 3 PSYCHIATRIC:  Normal affect    Signed, Shirlee More, MD  06/08/2018 9:03 AM    [place]

## 2018-06-08 ENCOUNTER — Ambulatory Visit: Payer: Federal, State, Local not specified - PPO | Admitting: Cardiology

## 2018-06-08 ENCOUNTER — Encounter: Payer: Self-pay | Admitting: Cardiology

## 2018-06-08 VITALS — BP 140/80 | HR 58 | Ht 66.0 in | Wt 162.4 lb

## 2018-06-08 DIAGNOSIS — I10 Essential (primary) hypertension: Secondary | ICD-10-CM

## 2018-06-08 DIAGNOSIS — F17200 Nicotine dependence, unspecified, uncomplicated: Secondary | ICD-10-CM | POA: Diagnosis not present

## 2018-06-08 DIAGNOSIS — E7800 Pure hypercholesterolemia, unspecified: Secondary | ICD-10-CM | POA: Diagnosis not present

## 2018-06-08 DIAGNOSIS — Z8249 Family history of ischemic heart disease and other diseases of the circulatory system: Secondary | ICD-10-CM | POA: Diagnosis not present

## 2018-06-08 DIAGNOSIS — E785 Hyperlipidemia, unspecified: Secondary | ICD-10-CM | POA: Insufficient documentation

## 2018-06-08 HISTORY — DX: Nicotine dependence, unspecified, uncomplicated: F17.200

## 2018-06-08 MED ORDER — TELMISARTAN 80 MG PO TABS: 80.0000 mg | ORAL_TABLET | Freq: Every day | ORAL | 3 refills | Status: DC

## 2018-06-08 MED ORDER — METOPROLOL SUCCINATE ER 25 MG PO TB24: 25.0000 mg | ORAL_TABLET | Freq: Every day | ORAL | 3 refills | Status: DC

## 2018-06-08 NOTE — Patient Instructions (Addendum)
Medication Instructions:  Your physician recommends that you continue on your current medications as directed. Please refer to the Current Medication list given to you today.  If you need a refill on your cardiac medications before your next appointment, please call your pharmacy.   Lab work: You will have labs today CMP and Lipid If you have labs (blood work) drawn today and your tests are completely normal, you will receive your results only by: Marland Kitchen MyChart Message (if you have MyChart) OR . A paper copy in the mail If you have any lab test that is abnormal or we need to change your treatment, we will call you to review the results.  Testing/Procedures: You had an EKG today  Your physician has requested that you have an abdominal aorta duplex. During this test, an ultrasound is used to evaluate the aorta. Allow 30 minutes for this exam. Do not eat after midnight the day before and avoid carbonated beverages  Follow-Up: At [facility], you and your health needs are our priority.  As part of our continuing mission to provide you with exceptional heart care, we have created designated Provider Care Teams.  These Care Teams include your primary Cardiologist (physician) and Advanced Practice Providers (APPs -  Physician Assistants and Nurse Practitioners) who all work together to provide you with the care you need, when you need it. You will need a follow up appointment in 1 years.  Please call our office 2 months in advance to schedule this appointment.    Coping with Quitting Smoking Quitting smoking is a physical and mental challenge. You will face cravings, withdrawal symptoms, and temptation. Before quitting, work with your health care provider to make a plan that can help you cope. Preparation can help you quit and keep you from giving in. How can I cope with cravings? Cravings usually last for 5-10 minutes. If you get through it, the craving will pass. Consider taking the following  actions to help you cope with cravings:  Keep your mouth busy: ? Chew sugar-free gum. ? Suck on hard candies or a straw. ? Brush your teeth.  Keep your hands and body busy: ? Immediately change to a different activity when you feel a craving. ? Squeeze or play with a ball. ? Do an activity or a hobby, like making bead jewelry, practicing needlepoint, or working with wood. ? Mix up your normal routine. ? Take a short exercise break. Go for a quick walk or run up and down stairs. ? Spend time in public places where smoking is not allowed.  Focus on doing something kind or helpful for someone else.  Call a friend or family member to talk during a craving.  Join a support group.  Call a quit line, such as 1-800-QUIT-NOW.  Talk with your health care provider about medicines that might help you cope with cravings and make quitting easier for you.  How can I deal with withdrawal symptoms? Your body may experience negative effects as it tries to get used to not having nicotine in the system. These effects are called withdrawal symptoms. They may include:  Feeling hungrier than normal.  Trouble concentrating.  Irritability.  Trouble sleeping.  Feeling depressed.  Restlessness and agitation.  Craving a cigarette.  To manage withdrawal symptoms:  Avoid places, people, and activities that trigger your cravings.  Remember why you want to quit.  Get plenty of sleep.  Avoid coffee and other caffeinated drinks. These may worsen some of your  symptoms.  How can I handle social situations? Social situations can be difficult when you are quitting smoking, especially in the first few weeks. To manage this, you can:  Avoid parties, bars, and other social situations where people might be smoking.  Avoid alcohol.  Leave right away if you have the urge to smoke.  Explain to your family and friends that you are quitting smoking. Ask for understanding and support.  Plan activities  with friends or family where smoking is not an option.  What are some ways I can cope with stress? Wanting to smoke may cause stress, and stress can make you want to smoke. Find ways to manage your stress. Relaxation techniques can help. For example:  Breathe slowly and deeply, in through your nose and out through your mouth.  Listen to soothing, relaxing music.  Talk with a family member or friend about your stress.  Light a candle.  Soak in a bath or take a shower.  Think about a peaceful place.  What are some ways I can prevent weight gain? Be aware that many people gain weight after they quit smoking. However, not everyone does. To keep from gaining weight, have a plan in place before you quit and stick to the plan after you quit. Your plan should include:  Having healthy snacks. When you have a craving, it may help to: ? Eat plain popcorn, crunchy carrots, celery, or other cut vegetables. ? Chew sugar-free gum.  Changing how you eat: ? Eat small portion sizes at meals. ? Eat 4-6 small meals throughout the day instead of 1-2 large meals a day. ? Be mindful when you eat. Do not watch television or do other things that might distract you as you eat.  Exercising regularly: ? Make time to exercise each day. If you do not have time for a long workout, do short bouts of exercise for 5-10 minutes several times a day. ? Do some form of strengthening exercise, like weight lifting, and some form of aerobic exercise, like running or swimming.  Drinking plenty of water or other low-calorie or no-calorie drinks. Drink 6-8 glasses of water daily, or as much as instructed by your health care provider.  Summary  Quitting smoking is a physical and mental challenge. You will face cravings, withdrawal symptoms, and temptation to smoke again. Preparation can help you as you go through these challenges.  You can cope with cravings by keeping your mouth busy (such as by chewing gum), keeping  your body and hands busy, and making calls to family, friends, or a helpline for people who want to quit smoking.  You can cope with withdrawal symptoms by avoiding places where people smoke, avoiding drinks with caffeine, and getting plenty of rest.  Ask your health care provider about the different ways to prevent weight gain, avoid stress, and handle social situations. This information is not intended to replace advice given to you by your health care provider. Make sure you discuss any questions you have with your health care provider. Document Released: 06/07/2016 Document Revised: 06/07/2016 Document Reviewed: 06/07/2016 Elsevier Interactive Patient Education  2018 [employer].  There is [employer] education and counseling through the [employer] and American heart Association.

## 2018-06-09 LAB — COMPREHENSIVE METABOLIC PANEL
ALK PHOS: 86 IU/L (ref 39–117)
ALT: 21 IU/L (ref 0–32)
AST: 16 IU/L (ref 0–40)
Albumin/Globulin Ratio: 2.2 (ref 1.2–2.2)
Albumin: 4.7 g/dL (ref 3.5–5.5)
BUN/Creatinine Ratio: 15 (ref 9–23)
BUN: 11 mg/dL (ref 6–24)
Bilirubin Total: 0.6 mg/dL (ref 0.0–1.2)
CALCIUM: 9.7 mg/dL (ref 8.7–10.2)
CO2: 22 mmol/L (ref 20–29)
CREATININE: 0.74 mg/dL (ref 0.57–1.00)
Chloride: 105 mmol/L (ref 96–106)
GFR calc Af Amer: 107 mL/min/{1.73_m2} (ref >59)
GFR calc non Af Amer: 93 mL/min/{1.73_m2} (ref >59)
GLUCOSE: 94 mg/dL (ref 65–99)
Globulin, Total: 2.1 g/dL (ref 1.5–4.5)
Potassium: 4.5 mmol/L (ref 3.5–5.2)
Sodium: 142 mmol/L (ref 134–144)
Total Protein: 6.8 g/dL (ref 6.0–8.5)

## 2018-06-09 LAB — LIPID PANEL
CHOLESTEROL TOTAL: 223 mg/dL — AB (ref 100–199)
Chol/HDL Ratio: 5.1 ratio — ABNORMAL HIGH (ref 0.0–4.4)
HDL: 44 mg/dL (ref >39)
LDL CALC: 160 mg/dL — AB (ref 0–99)
TRIGLYCERIDES: 97 mg/dL (ref 0–149)
VLDL CHOLESTEROL CAL: 19 mg/dL (ref 5–40)

## 2018-06-30 ENCOUNTER — Ambulatory Visit (HOSPITAL_BASED_OUTPATIENT_CLINIC_OR_DEPARTMENT_OTHER): Payer: Federal, State, Local not specified - PPO

## 2019-06-03 DIAGNOSIS — Z0000 Encounter for general adult medical examination without abnormal findings: Secondary | ICD-10-CM | POA: Diagnosis not present

## 2019-06-03 DIAGNOSIS — Z87891 Personal history of nicotine dependence: Secondary | ICD-10-CM | POA: Diagnosis not present

## 2019-06-03 DIAGNOSIS — E559 Vitamin D deficiency, unspecified: Secondary | ICD-10-CM | POA: Diagnosis not present

## 2019-06-03 DIAGNOSIS — E785 Hyperlipidemia, unspecified: Secondary | ICD-10-CM | POA: Diagnosis not present

## 2019-06-03 DIAGNOSIS — Z23 Encounter for immunization: Secondary | ICD-10-CM | POA: Diagnosis not present

## 2019-06-03 DIAGNOSIS — I10 Essential (primary) hypertension: Secondary | ICD-10-CM | POA: Diagnosis not present

## 2019-06-03 DIAGNOSIS — Z13220 Encounter for screening for lipoid disorders: Secondary | ICD-10-CM | POA: Diagnosis not present

## 2019-06-03 DIAGNOSIS — Z1331 Encounter for screening for depression: Secondary | ICD-10-CM | POA: Diagnosis not present

## 2019-06-04 ENCOUNTER — Ambulatory Visit: Payer: Federal, State, Local not specified - PPO | Admitting: Family

## 2019-06-04 ENCOUNTER — Other Ambulatory Visit: Payer: Self-pay | Admitting: Family Medicine

## 2019-06-04 DIAGNOSIS — Z1231 Encounter for screening mammogram for malignant neoplasm of breast: Secondary | ICD-10-CM

## 2019-06-04 DIAGNOSIS — N6310 Unspecified lump in the right breast, unspecified quadrant: Secondary | ICD-10-CM

## 2019-06-08 ENCOUNTER — Other Ambulatory Visit: Payer: Self-pay | Admitting: Physician Assistant

## 2019-07-12 ENCOUNTER — Other Ambulatory Visit: Payer: Self-pay | Admitting: Cardiology

## 2019-07-27 ENCOUNTER — Other Ambulatory Visit: Payer: Self-pay

## 2019-07-27 ENCOUNTER — Ambulatory Visit
Admission: RE | Admit: 2019-07-27 | Discharge: 2019-07-27 | Disposition: A | Discharge status: Home / Self Care | Payer: Federal, State, Local not specified - PPO | Source: Ambulatory Visit | Attending: Family Medicine | Admitting: Family Medicine

## 2019-07-27 ENCOUNTER — Ambulatory Visit: Payer: Federal, State, Local not specified - PPO

## 2019-07-27 DIAGNOSIS — Z85820 Personal history of malignant melanoma of skin: Secondary | ICD-10-CM | POA: Diagnosis not present

## 2019-07-27 DIAGNOSIS — D485 Neoplasm of uncertain behavior of skin: Secondary | ICD-10-CM | POA: Diagnosis not present

## 2019-07-27 DIAGNOSIS — L821 Other seborrheic keratosis: Secondary | ICD-10-CM | POA: Diagnosis not present

## 2019-07-27 DIAGNOSIS — D225 Melanocytic nevi of trunk: Secondary | ICD-10-CM | POA: Diagnosis not present

## 2019-07-27 DIAGNOSIS — R922 Inconclusive mammogram: Secondary | ICD-10-CM | POA: Diagnosis not present

## 2019-07-27 DIAGNOSIS — D2261 Melanocytic nevi of right upper limb, including shoulder: Secondary | ICD-10-CM | POA: Diagnosis not present

## 2019-07-27 DIAGNOSIS — L570 Actinic keratosis: Secondary | ICD-10-CM | POA: Diagnosis not present

## 2019-07-27 DIAGNOSIS — N6310 Unspecified lump in the right breast, unspecified quadrant: Secondary | ICD-10-CM

## 2019-08-09 ENCOUNTER — Other Ambulatory Visit: Payer: Self-pay | Admitting: Cardiology

## 2019-08-19 DIAGNOSIS — Z79899 Other long term (current) drug therapy: Secondary | ICD-10-CM | POA: Diagnosis not present

## 2019-08-19 DIAGNOSIS — Z6825 Body mass index (BMI) 25.0-25.9, adult: Secondary | ICD-10-CM | POA: Diagnosis not present

## 2019-08-19 DIAGNOSIS — I10 Essential (primary) hypertension: Secondary | ICD-10-CM | POA: Diagnosis not present

## 2019-09-28 DIAGNOSIS — I10 Essential (primary) hypertension: Secondary | ICD-10-CM | POA: Diagnosis not present

## 2019-09-28 DIAGNOSIS — E785 Hyperlipidemia, unspecified: Secondary | ICD-10-CM | POA: Diagnosis not present

## 2019-09-28 DIAGNOSIS — R5383 Other fatigue: Secondary | ICD-10-CM | POA: Diagnosis not present

## 2019-09-28 DIAGNOSIS — R42 Dizziness and giddiness: Secondary | ICD-10-CM | POA: Diagnosis not present

## 2020-07-13 DIAGNOSIS — Z20822 Contact with and (suspected) exposure to covid-19: Secondary | ICD-10-CM | POA: Diagnosis not present

## 2020-08-01 DIAGNOSIS — E785 Hyperlipidemia, unspecified: Secondary | ICD-10-CM | POA: Diagnosis not present

## 2020-08-01 DIAGNOSIS — E559 Vitamin D deficiency, unspecified: Secondary | ICD-10-CM | POA: Diagnosis not present

## 2020-08-01 DIAGNOSIS — I10 Essential (primary) hypertension: Secondary | ICD-10-CM | POA: Diagnosis not present

## 2020-08-01 DIAGNOSIS — F17200 Nicotine dependence, unspecified, uncomplicated: Secondary | ICD-10-CM | POA: Diagnosis not present

## 2020-10-24 DIAGNOSIS — D225 Melanocytic nevi of trunk: Secondary | ICD-10-CM | POA: Diagnosis not present

## 2020-10-24 DIAGNOSIS — D2261 Melanocytic nevi of right upper limb, including shoulder: Secondary | ICD-10-CM | POA: Diagnosis not present

## 2020-10-24 DIAGNOSIS — Z85820 Personal history of malignant melanoma of skin: Secondary | ICD-10-CM | POA: Diagnosis not present

## 2020-10-24 DIAGNOSIS — D224 Melanocytic nevi of scalp and neck: Secondary | ICD-10-CM | POA: Diagnosis not present

## 2020-11-24 DIAGNOSIS — N3001 Acute cystitis with hematuria: Secondary | ICD-10-CM | POA: Diagnosis not present

## 2020-11-24 DIAGNOSIS — N3090 Cystitis, unspecified without hematuria: Secondary | ICD-10-CM | POA: Diagnosis not present

## 2020-12-07 DIAGNOSIS — N3001 Acute cystitis with hematuria: Secondary | ICD-10-CM | POA: Diagnosis not present

## 2020-12-07 DIAGNOSIS — N3091 Cystitis, unspecified with hematuria: Secondary | ICD-10-CM | POA: Diagnosis not present

## 2021-01-30 DIAGNOSIS — Z6825 Body mass index (BMI) 25.0-25.9, adult: Secondary | ICD-10-CM | POA: Diagnosis not present

## 2021-01-30 DIAGNOSIS — J3489 Other specified disorders of nose and nasal sinuses: Secondary | ICD-10-CM | POA: Diagnosis not present

## 2021-03-06 DIAGNOSIS — E785 Hyperlipidemia, unspecified: Secondary | ICD-10-CM | POA: Diagnosis not present

## 2021-03-06 DIAGNOSIS — Z1331 Encounter for screening for depression: Secondary | ICD-10-CM | POA: Diagnosis not present

## 2021-03-06 DIAGNOSIS — E559 Vitamin D deficiency, unspecified: Secondary | ICD-10-CM | POA: Diagnosis not present

## 2021-03-06 DIAGNOSIS — I10 Essential (primary) hypertension: Secondary | ICD-10-CM | POA: Diagnosis not present

## 2021-03-06 DIAGNOSIS — Z87891 Personal history of nicotine dependence: Secondary | ICD-10-CM | POA: Diagnosis not present

## 2021-03-16 ENCOUNTER — Other Ambulatory Visit: Payer: Self-pay | Admitting: Physician Assistant

## 2021-03-16 DIAGNOSIS — Z87891 Personal history of nicotine dependence: Secondary | ICD-10-CM

## 2021-04-03 DIAGNOSIS — Z87891 Personal history of nicotine dependence: Secondary | ICD-10-CM | POA: Diagnosis not present

## 2021-04-03 DIAGNOSIS — I10 Essential (primary) hypertension: Secondary | ICD-10-CM | POA: Diagnosis not present

## 2021-04-03 DIAGNOSIS — Z6826 Body mass index (BMI) 26.0-26.9, adult: Secondary | ICD-10-CM | POA: Diagnosis not present

## 2021-04-11 ENCOUNTER — Ambulatory Visit
Admission: RE | Admit: 2021-04-11 | Discharge: 2021-04-11 | Disposition: A | Discharge status: Home / Self Care | Payer: Federal, State, Local not specified - PPO | Source: Ambulatory Visit | Attending: Physician Assistant | Admitting: Physician Assistant

## 2021-04-11 ENCOUNTER — Other Ambulatory Visit: Payer: Self-pay

## 2021-04-11 DIAGNOSIS — Z87891 Personal history of nicotine dependence: Secondary | ICD-10-CM

## 2021-04-11 DIAGNOSIS — F17210 Nicotine dependence, cigarettes, uncomplicated: Secondary | ICD-10-CM | POA: Diagnosis not present

## 2021-05-02 DIAGNOSIS — I10 Essential (primary) hypertension: Secondary | ICD-10-CM | POA: Diagnosis not present

## 2021-05-02 DIAGNOSIS — I700 Atherosclerosis of aorta: Secondary | ICD-10-CM | POA: Diagnosis not present

## 2021-05-02 DIAGNOSIS — Z87891 Personal history of nicotine dependence: Secondary | ICD-10-CM | POA: Diagnosis not present

## 2021-07-25 DIAGNOSIS — Z85820 Personal history of malignant melanoma of skin: Secondary | ICD-10-CM | POA: Diagnosis not present

## 2021-07-25 DIAGNOSIS — D2262 Melanocytic nevi of left upper limb, including shoulder: Secondary | ICD-10-CM | POA: Diagnosis not present

## 2021-07-25 DIAGNOSIS — D2261 Melanocytic nevi of right upper limb, including shoulder: Secondary | ICD-10-CM | POA: Diagnosis not present

## 2021-07-25 DIAGNOSIS — D225 Melanocytic nevi of trunk: Secondary | ICD-10-CM | POA: Diagnosis not present

## 2021-10-02 DIAGNOSIS — N390 Urinary tract infection, site not specified: Secondary | ICD-10-CM | POA: Diagnosis not present

## 2021-10-17 DIAGNOSIS — R519 Headache, unspecified: Secondary | ICD-10-CM | POA: Diagnosis not present

## 2021-10-17 DIAGNOSIS — W57XXXA Bitten or stung by nonvenomous insect and other nonvenomous arthropods, initial encounter: Secondary | ICD-10-CM | POA: Diagnosis not present

## 2021-10-17 DIAGNOSIS — I10 Essential (primary) hypertension: Secondary | ICD-10-CM | POA: Diagnosis not present

## 2021-10-17 DIAGNOSIS — E785 Hyperlipidemia, unspecified: Secondary | ICD-10-CM | POA: Diagnosis not present

## 2021-11-02 DIAGNOSIS — I10 Essential (primary) hypertension: Secondary | ICD-10-CM | POA: Diagnosis not present

## 2021-11-02 DIAGNOSIS — Z87891 Personal history of nicotine dependence: Secondary | ICD-10-CM | POA: Diagnosis not present

## 2021-11-02 DIAGNOSIS — E785 Hyperlipidemia, unspecified: Secondary | ICD-10-CM | POA: Diagnosis not present

## 2021-12-18 DIAGNOSIS — R3912 Poor urinary stream: Secondary | ICD-10-CM | POA: Diagnosis not present

## 2021-12-18 DIAGNOSIS — Z6825 Body mass index (BMI) 25.0-25.9, adult: Secondary | ICD-10-CM | POA: Diagnosis not present

## 2022-01-02 DIAGNOSIS — Z01419 Encounter for gynecological examination (general) (routine) without abnormal findings: Secondary | ICD-10-CM | POA: Diagnosis not present

## 2022-01-02 DIAGNOSIS — Z6825 Body mass index (BMI) 25.0-25.9, adult: Secondary | ICD-10-CM | POA: Diagnosis not present

## 2022-01-02 DIAGNOSIS — Z1231 Encounter for screening mammogram for malignant neoplasm of breast: Secondary | ICD-10-CM | POA: Diagnosis not present

## 2022-01-02 DIAGNOSIS — R1031 Right lower quadrant pain: Secondary | ICD-10-CM | POA: Diagnosis not present

## 2022-01-02 DIAGNOSIS — Z1272 Encounter for screening for malignant neoplasm of vagina: Secondary | ICD-10-CM | POA: Diagnosis not present

## 2022-01-10 DIAGNOSIS — D485 Neoplasm of uncertain behavior of skin: Secondary | ICD-10-CM | POA: Diagnosis not present

## 2022-01-10 DIAGNOSIS — B078 Other viral warts: Secondary | ICD-10-CM | POA: Diagnosis not present

## 2022-02-06 LAB — EXTERNAL GENERIC LAB PROCEDURE: COLOGUARD: NEGATIVE

## 2022-02-06 LAB — COLOGUARD: COLOGUARD: NEGATIVE

## 2022-02-15 DIAGNOSIS — I10 Essential (primary) hypertension: Secondary | ICD-10-CM | POA: Diagnosis not present

## 2022-02-15 DIAGNOSIS — J309 Allergic rhinitis, unspecified: Secondary | ICD-10-CM | POA: Diagnosis not present

## 2022-02-19 DIAGNOSIS — H04123 Dry eye syndrome of bilateral lacrimal glands: Secondary | ICD-10-CM | POA: Diagnosis not present

## 2022-02-19 DIAGNOSIS — H5712 Ocular pain, left eye: Secondary | ICD-10-CM | POA: Diagnosis not present

## 2022-03-04 DIAGNOSIS — G4700 Insomnia, unspecified: Secondary | ICD-10-CM | POA: Diagnosis not present

## 2022-03-04 DIAGNOSIS — I10 Essential (primary) hypertension: Secondary | ICD-10-CM | POA: Diagnosis not present

## 2022-03-04 DIAGNOSIS — Z13228 Encounter for screening for other metabolic disorders: Secondary | ICD-10-CM | POA: Diagnosis not present

## 2022-03-20 DIAGNOSIS — G4700 Insomnia, unspecified: Secondary | ICD-10-CM | POA: Diagnosis not present

## 2022-03-20 DIAGNOSIS — E559 Vitamin D deficiency, unspecified: Secondary | ICD-10-CM | POA: Diagnosis not present

## 2022-03-20 DIAGNOSIS — I10 Essential (primary) hypertension: Secondary | ICD-10-CM | POA: Diagnosis not present

## 2022-03-20 DIAGNOSIS — Z13228 Encounter for screening for other metabolic disorders: Secondary | ICD-10-CM | POA: Diagnosis not present

## 2022-03-20 DIAGNOSIS — Z1329 Encounter for screening for other suspected endocrine disorder: Secondary | ICD-10-CM | POA: Diagnosis not present

## 2022-04-17 DIAGNOSIS — E785 Hyperlipidemia, unspecified: Secondary | ICD-10-CM | POA: Diagnosis not present

## 2022-04-17 DIAGNOSIS — Z0001 Encounter for general adult medical examination with abnormal findings: Secondary | ICD-10-CM | POA: Diagnosis not present

## 2022-04-17 DIAGNOSIS — Z136 Encounter for screening for cardiovascular disorders: Secondary | ICD-10-CM | POA: Diagnosis not present

## 2022-04-17 DIAGNOSIS — I10 Essential (primary) hypertension: Secondary | ICD-10-CM | POA: Diagnosis not present

## 2022-09-18 IMAGING — CT CT CHEST LUNG CANCER SCREENING LOW DOSE W/O CM
1 series · 15 of 32 positions shown, 19 images · non-contrast
Comparison: 08/17/2007.

CLINICAL DATA: Current smoker, 35 pack-year history. History
melanoma excision.

EXAM:
CT CHEST WITHOUT CONTRAST LOW-DOSE FOR LUNG CANCER SCREENING
TECHNIQUE: Multidetector CT imaging of the chest was performed following the
standard protocol without IV contrast.

[Series 2: ldct screening <30 bmi · axial · 0.64mm/px · z∈[-308,+2]mm · 15 of 71 slices shown, 19 images]
[im 6/71  mediastinal]
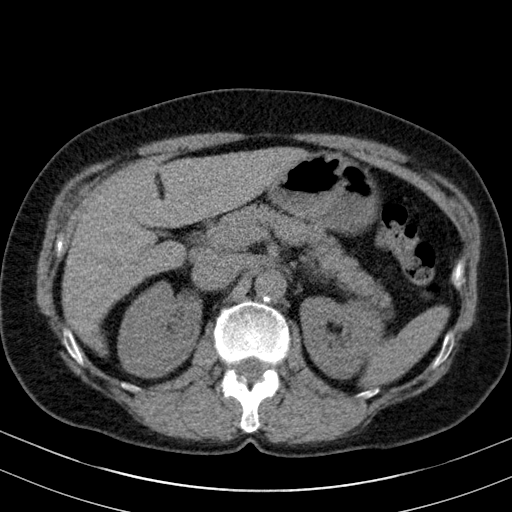
[im 6/71  lung]
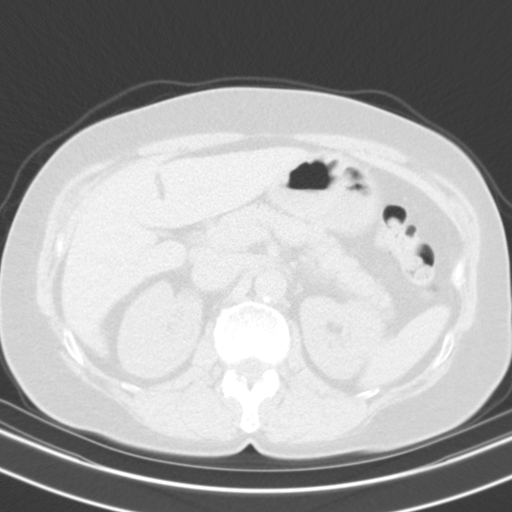
[im 11/71  lung]
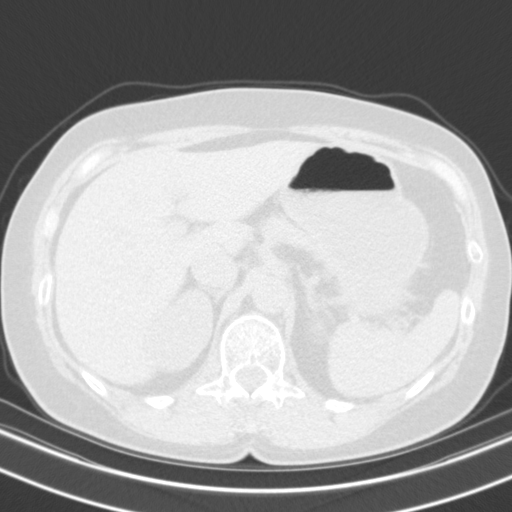
[im 15/71  lung]
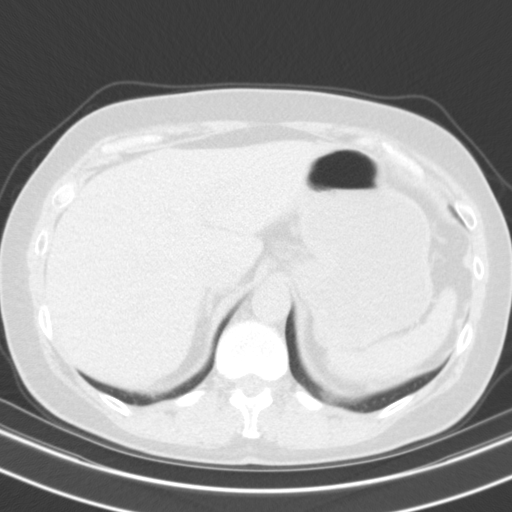
[im 19/71  lung]
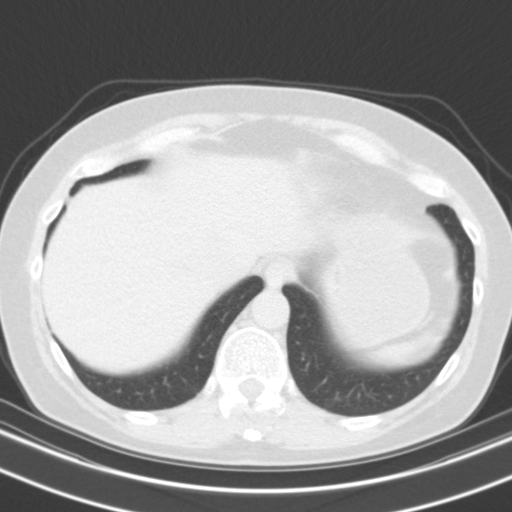
[im 24/71  mediastinal]
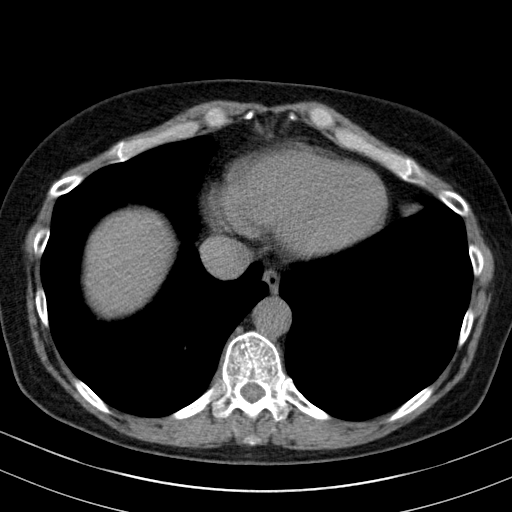
[im 24/71  lung]
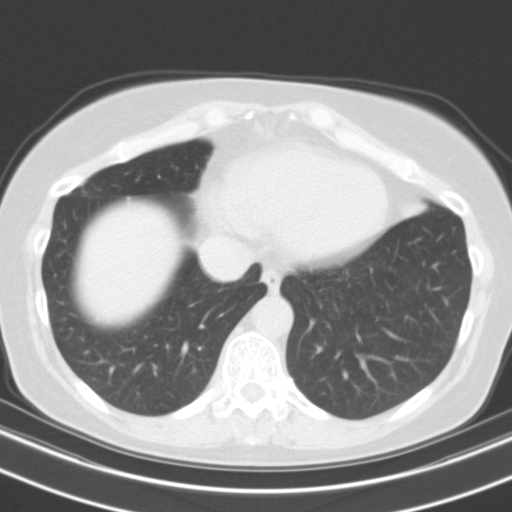
[im 29/71  lung]
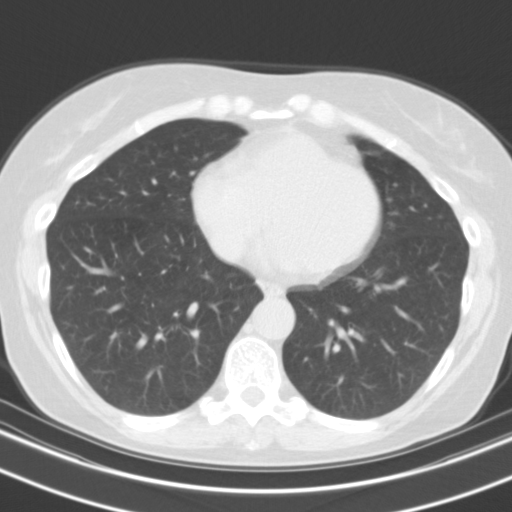
[im 34/71  lung]
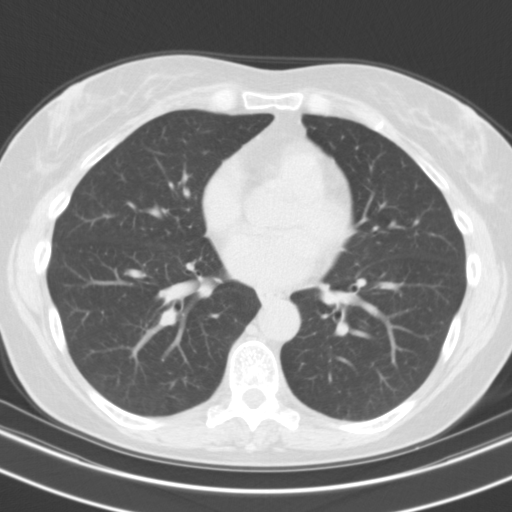
[im 38/71  lung]
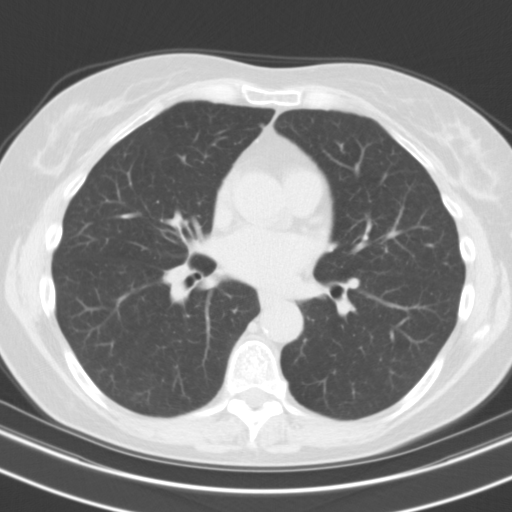
[im 42/71  mediastinal]
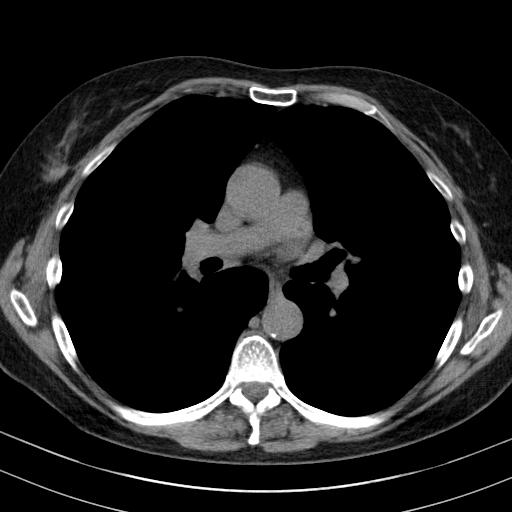
[im 42/71  lung]
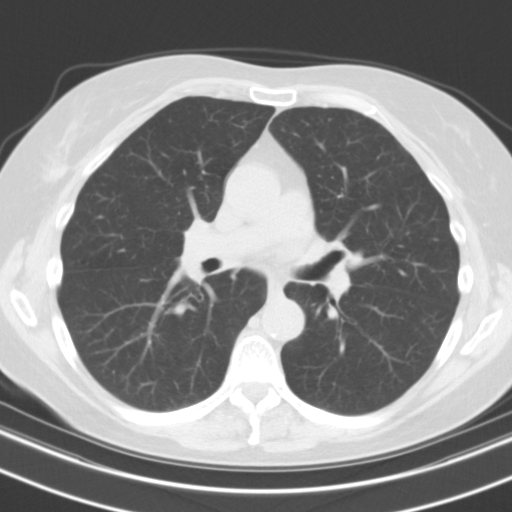
[im 45/71  lung]
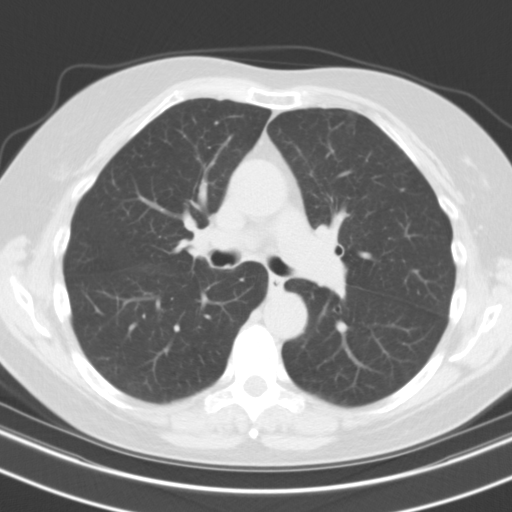
[im 50/71  lung]
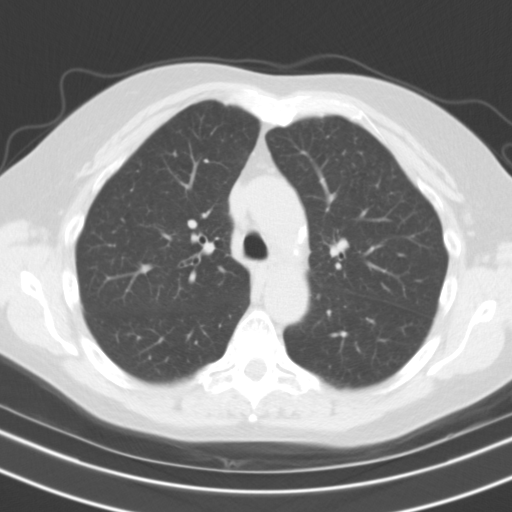
[im 55/71  lung]
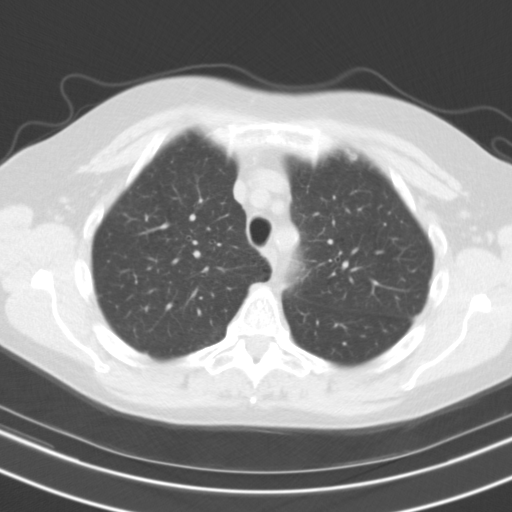
[im 58/71  mediastinal]
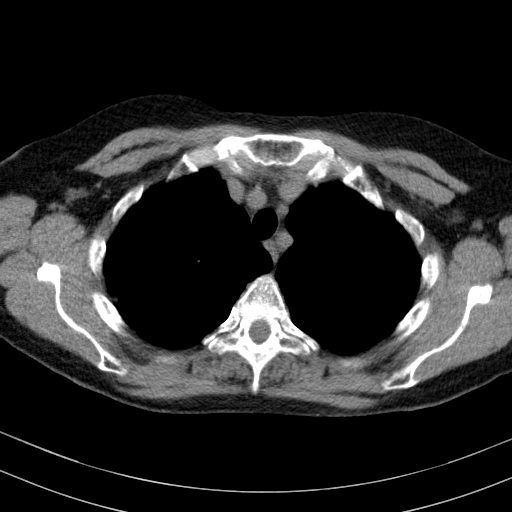
[im 58/71  lung]
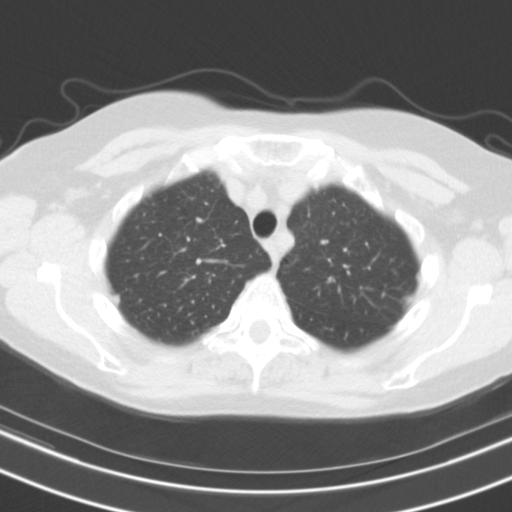
[im 63/71  lung]
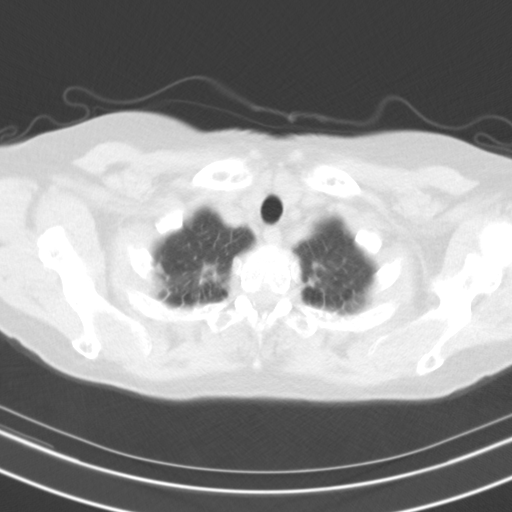
[im 68/71  lung]
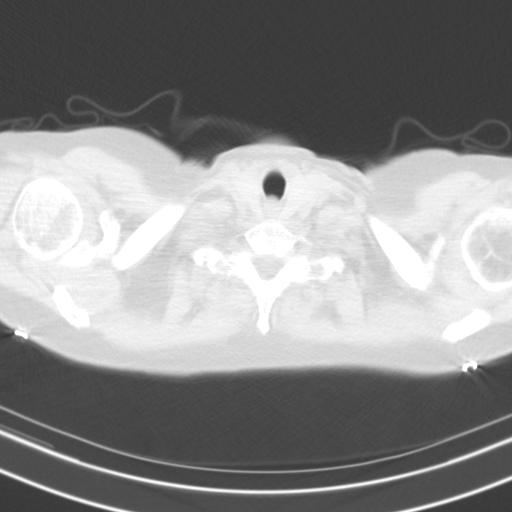

[15 of 32 positions shown; findings below may reference images not displayed]

FINDINGS: Cardiovascular: Atherosclerotic calcification of the aorta. Heart
size normal. No pericardial effusion.

Mediastinum/Nodes: No pathologically enlarged mediastinal or
axillary lymph nodes. Hilar regions are difficult to definitively
evaluate without IV contrast but appear grossly unremarkable.
Esophagus is grossly unremarkable.

Lungs/Pleura: Biapical pleuroparenchymal scarring with inferior
extension along the subpleural regions. No suspicious pulmonary
nodules. Centrilobular and paraseptal emphysema. Smoking related
respiratory bronchiolitis. No pleural fluid. Debris is seen in the
airway.

Upper Abdomen: Visualized portions of the liver, adrenal glands,
kidneys, spleen, pancreas, stomach and bowel are grossly
unremarkable.

Musculoskeletal: Degenerative changes in the spine. No worrisome
lytic or sclerotic lesions.
IMPRESSION: 1. Lung-RADS 1, negative. Continue annual screening with low-dose
chest CT without contrast in 12 months.
2.  Aortic atherosclerosis (7MQ3I-5Y8.8).
3.  Emphysema (7MQ3I-MRO.5).

## 2023-06-17 DIAGNOSIS — N926 Irregular menstruation, unspecified: Secondary | ICD-10-CM | POA: Insufficient documentation

## 2023-06-17 DIAGNOSIS — N399 Disorder of urinary system, unspecified: Secondary | ICD-10-CM

## 2023-06-17 DIAGNOSIS — R32 Unspecified urinary incontinence: Secondary | ICD-10-CM | POA: Insufficient documentation

## 2023-06-17 DIAGNOSIS — N951 Menopausal and female climacteric states: Secondary | ICD-10-CM | POA: Insufficient documentation

## 2023-06-17 DIAGNOSIS — R102 Pelvic and perineal pain: Secondary | ICD-10-CM

## 2023-06-17 DIAGNOSIS — R350 Frequency of micturition: Secondary | ICD-10-CM | POA: Insufficient documentation

## 2023-06-17 HISTORY — DX: Unspecified urinary incontinence: R32

## 2023-06-17 HISTORY — DX: Disorder of urinary system, unspecified: N39.9

## 2023-06-17 HISTORY — DX: Pelvic and perineal pain: R10.2

## 2023-06-17 HISTORY — DX: Menopausal and female climacteric states: N95.1

## 2023-06-17 HISTORY — DX: Frequency of micturition: R35.0

## 2023-06-17 HISTORY — DX: Irregular menstruation, unspecified: N92.6

## 2023-06-20 ENCOUNTER — Ambulatory Visit: Payer: Federal, State, Local not specified - PPO

## 2023-06-20 VITALS — BP 134/76 | HR 58 | Ht 66.0 in | Wt 163.8 lb

## 2023-06-20 DIAGNOSIS — R0602 Shortness of breath: Secondary | ICD-10-CM

## 2023-06-20 DIAGNOSIS — I10 Essential (primary) hypertension: Secondary | ICD-10-CM

## 2023-06-20 DIAGNOSIS — R079 Chest pain, unspecified: Secondary | ICD-10-CM | POA: Insufficient documentation

## 2023-06-20 HISTORY — DX: Shortness of breath: R06.02

## 2023-06-20 HISTORY — DX: Chest pain, unspecified: R07.9

## 2023-06-20 NOTE — Progress Notes (Signed)
Cardiology Consultation:    Date:  06/20/2023   ID:  Heather Liu, DOB August 11, 1965, MRN [id number]  PCP:  Lars Mage, NP  Cardiologist:  Luretha Murphy, MD   Referring MD: Lonie Peak, PA-C   No chief complaint on file.    ASSESSMENT AND PLAN:   [name] 57 year old woman with history of prediabetes, hypertension, hyperlipidemia, smokes tobacco, migraine, family history of aortic aneurysm, presents for further evaluation of chest pain. Does not prefer to be on statins and has been managing her hyperlipidemia with dietary modifications.  No recent lipid panel available for review. She already had an event monitor done through PCPs office and returned it about a week ago, results currently pending.  I did ask her to call our office and notify us when she has the results.  She prefers phone calls be made to her or her husband to review any test results.  She does not have an email address and does not wish to set up MyChart.  Problem List Items Addressed This Visit     Essential hypertension - Primary   Well-controlled. Target below 130 over 80 mmHg. Continue with her current medications chlorthalidone 25 mg once daily Telmisartan 80 mg once daily Metoprolol succinate 25 mg once daily.       Relevant Medications   chlorthalidone (HYGROTON) 25 MG tablet   Other Relevant Orders   EKG 12-Lead (Completed)   ECHOCARDIOGRAM COMPLETE   Chest pain   Chest pain atypical and appears noncardiac in description. However she does have shortness of breath that is been slowly progressive over the past couple months, and she does have risk factors.  In the setting of progressive dyspnea on exertion, and atypical chest pain symptoms and risk factors, further evaluate with transthoracic echocardiogram and Lexiscan stress test with nuclear imaging.       Relevant Orders   ECHOCARDIOGRAM COMPLETE   MYOCARDIAL PERFUSION IMAGING   Shortness of breath   Progressive  shortness of breath over the past couple months. Could be anginal equivalent given her cardiovascular risk factors. Further evaluate with transthoracic echocardiogram and Lexiscan stress test.        Return to clinic tentatively in 3 months.    History of Present Illness:    Heather Liu is a 57 y.o. female who is being seen today for the evaluation of chest pain at the request of Lonie Peak, [place].  Previously seen by Dr. Dulce Sellar at our office in 2019.  Pleasant woman here for the visit accompanied by her husband.  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  Has history of hypertension, hyperlipidemia, smokes tobacco, prediabetes, migraine family history of aortic aneurysm [sister dying from complicated aortic aneurysm repair]  Referred for further evaluation of atypical chest pain.  She describes over the past couple months she has had instances of chest discomfort that feels like a sharp to pressure-like sensation retrosternal radiating to the neck into the left arm.  Last for few seconds.  No sustained episodes.  Had similar episodes while she had heart monitor on over the last couple weeks.  No obvious trigger.  No worsening with position, activity.  She also reports associated shortness of breath that she has been noticing over the past couple months which she feels slightly more out of breath with activity that she was previously used to.  Denies any lightheadedness, dizziness, syncopal episodes.  Denies any pedal edema.  Denies any claudication symptoms.  Feels she has been sleeping better over the past couple months but does not feel refreshed in the morning.  Husband does note she snores at night.  Has not had formal evaluation for sleep apnea.  Continues to smoke cigarettes, has cut down to three fourths of a pack. No alcohol use.  No recreational drug use. Drinks decaffeinated tea  and half caffeine coffee at times. No caffeinated sodas.  Has been on telmisartan and metoprolol for blood pressure control for over 20 years.  Recently was started on chlorthalidone 25 mg once a day about 7 to 8 months ago.  Tolerating well.  Mentions blood pressures have been well-controlled.  Mentions she does not want to take statins and wants to continue with dietary modifications for cholesterol control.  Mentions that she has had screening done for abdominal aortic aneurysm couple years ago and was unremarkable.  Continues to follow-up with her PCP.  EKG in the clinic today shows sinus rhythm heart rate 58/min, normal PR interval 172 ms, QRS duration 94 ms, incomplete RBBB with RSR prime pattern in V1 V2.  In comparison prior EKG from May 2017 was similar.   Blood work from 06/13/2023 noted sodium 139, potassium 4, BUN 14, creatinine 0.79, EGFR 86 Normal transaminases and alkaline phosphatase Magnesium 2.2 Hemoglobin 14.7, hematocrit 44.5, WBC 12.3, platelets 279  She mentions having had lipid panel done recently through PCPs. Last lipid panel I have to review is from October 17, 2021 total cholesterol 222, HDL 46, LDL 156, triglycerides 112. Last hemoglobin A1c available to review is from December 2010 5.6.  Past Medical History:  Diagnosis Date   Erosion of vaginal mesh (HCC) 2017   sling placed 2015   Essential hypertension 06/07/2018   Hyperlipidemia    Hypertension     Past Surgical History:  Procedure Laterality Date   ANTERIOR AND POSTERIOR REPAIR N/A 02/21/2014   Procedure: ANTERIOR (CYSTOCELE) REPAIR ;  Surgeon: Essie Hart, MD;  Location: [facility];  Service: Gynecology;  Laterality: N/A;   BLADDER SUSPENSION N/A 02/21/2014   Procedure: TRANSVAGINAL TAPE (TVT) PROCEDURE WITH CYSTO;  Surgeon: Essie Hart, MD;  Location: [facility];  Service: Gynecology;  Laterality: N/A;   BLADDER SUSPENSION N/A 11/09/2015   Procedure: REPAIR WITH COLOPLAST AXIS DERMIS**;  Surgeon: Jethro Bolus, MD;  Location: [facility];  Service: Urology;  Laterality: N/A;   DILATION AND CURETTAGE OF UTERUS     SAB   TRANSVAGINAL TAPE (TVT) REMOVAL N/A 11/09/2015   Procedure: *REMOVE OF TVT VAGINAL TAPE AND REPAIR WITH COLOPLAST AXIS DERMIS**;  Surgeon: Jethro Bolus, MD;  Location: [facility];  Service: Urology;  Laterality: N/A;   VAGINAL HYSTERECTOMY N/A 02/21/2014   Procedure: HYSTERECTOMY VAGINAL;  Surgeon: Essie Hart, MD;  Location: [facility];  Service: Gynecology;  Laterality: N/A;    Current Medications: Current Meds  Medication Sig   chlorthalidone (HYGROTON) 25 MG tablet Take 1 tablet by mouth daily.   Cholecalciferol (VITAMIN D-3) 5000 UNITS TABS Take 1 capsule by mouth daily.    metoprolol succinate (TOPROL-XL) 25 MG 24 hr tablet Take 1 tablet (25 mg total) by mouth daily. PLEASE CALL OFFICE TO SCHEDULE APPOINTMENT FOR FURTHER REFILLS   Multiple Vitamins-Minerals (MULTIVITAMIN PO) Take 2 tablets by mouth daily.   NON FORMULARY Take 1 tablet by mouth daily. Prolent   telmisartan (MICARDIS) 80 MG tablet Take 1 tablet (80 mg total) by mouth daily. PLEASE  CALL OFFICE TO SCHEDULE APPOINTMENT FOR FURTHER REFILLS     Allergies:   Codeine, Morphine and codeine, Other, and Sulfa antibiotics   Social History   Socioeconomic History   Marital status: Married    Spouse name: Not on file   Number of children: Not on file   Years of education: Not on file   Highest education level: Not on file  Occupational History   Not on file  Tobacco Use   Smoking status: Every Day    Current packs/day: 1.00    Average packs/day: 1 pack/day for 35.0 years (35.0 ttl pk-yrs)    Types: Cigarettes   Smokeless tobacco: Never  Vaping Use   Vaping status: Never Used  Substance and Sexual Activity   Alcohol use: No   Drug use: No   Sexual activity: Not on file  Other Topics Concern   Not on file  Social History Narrative   Not on file   Social Drivers  of Health   Financial Resource Strain: Not on file  Food Insecurity: Not on file  Transportation Needs: Not on file  Physical Activity: Not on file  Stress: Not on file  Social Connections: Not on file     Family History: The patient's family history includes Heart attack in her paternal grandfather and sister; Hypertension in her father and mother; Pancreatic cancer in her father; Stroke in her maternal aunt, maternal grandmother, and maternal uncle. ROS:   Please see the history of present illness.    All 14 point review of systems negative except as described per history of present illness.  EKGs/Labs/Other Studies Reviewed:    The following studies were reviewed today:   EKG:  EKG Interpretation Date/Time:  Friday June 20 2023 10:14:03 EST Ventricular Rate:  58 PR Interval:  172 QRS Duration:  94 QT Interval:  436 QTC Calculation: 428 R Axis:   6  Text Interpretation: Sinus bradycardia Possible Left atrial enlargement Low voltage QRS Incomplete right bundle branch block Septal infarct , age undetermined Abnormal ECG When compared with ECG of 09-Nov-2015 07:29, Septal infarct is now Present Confirmed by Huntley Dec reddy [phone]) on 06/20/2023 9:18:37 AM    Recent Labs: No results found for requested labs within last 365 days.  Recent Lipid Panel    Component Value Date/Time   CHOL 223 (H) 06/08/2018 0922   TRIG 97 06/08/2018 0922   HDL 44 06/08/2018 0922   CHOLHDL 5.1 (H) 06/08/2018 0922   LDLCALC 160 (H) 06/08/2018 6213    Physical Exam:    VS:  BP 134/76   Pulse (!) 58   Ht 5\' 6"  (1.676 m)   Wt 163 lb 12.8 oz (74.3 kg)   SpO2 94%   BMI 26.44 kg/m     Wt Readings from Last 3 Encounters:  06/20/23 163 lb 12.8 oz (74.3 kg)  06/08/18 162 lb 6.4 oz (73.7 kg)  11/09/15 158 lb 8 oz (71.9 kg)     GENERAL:  Well nourished, well developed in no acute distress NECK: No JVD; No carotid bruits CARDIAC: RRR, S1 and S2 present, no murmurs, no rubs, no  gallops CHEST:  Clear to auscultation without rales, wheezing or rhonchi  Extremities: No pitting pedal edema. Pulses bilaterally symmetric with radial 2+ and dorsalis pedis 2+ NEUROLOGIC:  Alert and oriented x 3  Medication Adjustments/Labs and Tests Ordered: Current medicines are reviewed at length with the patient today.  Concerns regarding medicines are outlined above.  Orders Placed This Encounter  Procedures   MYOCARDIAL PERFUSION IMAGING   EKG 12-Lead   ECHOCARDIOGRAM COMPLETE   No orders of the defined types were placed in this encounter.   Signed, Cecille Amsterdam, MD, MPH, [facility]. 06/20/2023 9:52 AM    [facility]

## 2023-06-20 NOTE — Addendum Note (Signed)
Addended by: Huntley Dec REDDY on: 06/20/2023 05:02 PM   Modules accepted: Orders

## 2023-06-20 NOTE — Patient Instructions (Signed)
Medication Instructions:   Your physician recommends that you continue on your current medications as directed. Please refer to the Current Medication list given to you today.   *If you need a refill on your cardiac medications before your next appointment, please call your pharmacy*   Lab Work: None ordered If you have labs (blood work) drawn today and your tests are completely normal, you will receive your results only by: MyChart Message (if you have MyChart) OR A paper copy in the mail If you have any lab test that is abnormal or we need to change your treatment, we will call you to review the results.   Testing/Procedures:   [facility] Cardiovascular [facility] at [facility] [address] [place], [place] [address] Phone: [phone]   Please arrive 15 minutes prior to your appointment time for registration and insurance purposes.  The test will take approximately 3 to 4 hours to complete; you may bring reading material.  If someone comes with you to your appointment, they will need to remain in the main lobby due to limited space in the testing area. **If you are pregnant or breastfeeding, please notify the nuclear lab prior to your appointment**  How to prepare for your Myocardial Perfusion Test: Do not eat or drink 3 hours prior to your test, except you may have water. Do not consume products containing caffeine (regular or decaffeinated) 12 hours prior to your test. (ex: coffee, chocolate, sodas, tea). Do bring a list of your current medications with you.  If not listed below, you may take your medications as normal. Do not take metoprolol (Lopressor, Toprol) for 24 hours prior to the test.  Bring the medication to your appointment as you may be required to take it once the test is complete. Do wear comfortable clothes (no dresses or overalls) and walking shoes, tennis shoes preferred (No heels or open toe shoes are allowed). Do NOT wear cologne, perfume, aftershave, or lotions  (deodorant is allowed). If these instructions are not followed, your test will have to be rescheduled.  If you cannot keep your appointment, please provide 24 hours notification to the Nuclear Lab, to avoid a possible $50 charge to your account.     Your physician has requested that you have an echocardiogram. Echocardiography is a painless test that uses sound waves to create images of your heart. It provides your doctor with information about the size and shape of your heart and how well your heart's chambers and valves are working. This procedure takes approximately one hour. There are no restrictions for this procedure. Please do NOT wear cologne, perfume, aftershave, or lotions (deodorant is allowed). Please arrive 15 minutes prior to your appointment time.  Please note: We ask at that you not bring children with you during ultrasound (echo/ vascular) testing. Due to room size and safety concerns, children are not allowed in the ultrasound rooms during exams. Our front office staff cannot provide observation of children in our lobby area while testing is being conducted. An adult accompanying a patient to their appointment will only be allowed in the ultrasound room at the discretion of the ultrasound technician under special circumstances. We apologize for any inconvenience.    Follow-Up: At [facility], you and your health needs are our priority.  As part of our continuing mission to provide you with exceptional heart care, we have created designated Provider Care Teams.  These Care Teams include your primary Cardiologist (physician) and Advanced Practice Providers (APPs -  Physician  Assistants and Nurse Practitioners) who all work together to provide you with the care you need, when you need it.  We recommend signing up for the patient portal called "MyChart".  Sign up information is provided on this After Visit Summary.  MyChart is used to connect with patients for Virtual Visits  (Telemedicine).  Patients are able to view lab/test results, encounter notes, upcoming appointments, etc.  Non-urgent messages can be sent to your provider as well.   To learn more about what you can do with MyChart, go to [email or web address].    Your next appointment:   3 month(s)  Provider:   Huntley Dec, MD   Other Instructions  Cardiac Nuclear Scan A cardiac nuclear scan is a test that is done to check the flow of blood to your heart. It is done when you are resting and when you are exercising. The test looks for problems such as: Not enough blood reaching a portion of the heart. The heart muscle not working as it should. You may need this test if you have: Heart disease. Lab results that are not normal. Had heart surgery or a balloon procedure to open up blocked arteries (angioplasty) or a small mesh tube (stent). Chest pain. Shortness of breath. Had a heart attack. In this test, a special dye (tracer) is put into your bloodstream. The tracer will travel to your heart. A camera will then take pictures of your heart to see how the tracer moves through your heart. This test is usually done at a hospital and takes 2-4 hours. Tell a doctor about: Any allergies you have. All medicines you are taking, including vitamins, herbs, eye drops, creams, and over-the-counter medicines. Any bleeding problems you have. Any surgeries you have had. Any medical conditions you have. Whether you are pregnant or may be pregnant. Any history of asthma or long-term (chronic) lung disease. Any history of heart rhythm disorders or heart valve conditions. What are the risks? Your doctor will talk with you about risks. These may include: Serious chest pain and heart attack. This is only a risk if the stress portion of the test is done. Fast or uneven heartbeats (palpitations). A feeling of warmth in your chest. This feeling usually does not last long. Allergic reaction to the  tracer. Shortness of breath or trouble breathing. What happens before the test? Ask your doctor about changing or stopping your normal medicines. Follow instructions from your doctor about what you cannot eat or drink. Remove your jewelry on the day of the test. Ask your doctor if you need to avoid nicotine or caffeine. What happens during the test? An IV tube will be inserted into one of your veins. Your doctor will give you a small amount of tracer through the IV tube. You will wait for 20-40 minutes while the tracer moves through your bloodstream. Your heart will be monitored with an electrocardiogram (ECG). You will lie down on an exam table. Pictures of your heart will be taken for about 15-20 minutes. You may also have a stress test. For this test, one of these things may be done: You will be asked to exercise on a treadmill or a stationary bike. You will be given medicines that will make your heart work harder. This is done if you are unable to exercise. When blood flow to your heart has peaked, a tracer will again be given through the IV tube. After 20-40 minutes, you will get back on the exam table. More pictures will be taken  of your heart. Depending on the tracer that is used, more pictures may need to be taken 3-4 hours later. Your IV tube will be removed when the test is over. The test may vary among doctors and hospitals. What happens after the test? Ask your doctor: Whether you can return to your normal schedule, including diet, activities, travel, and medicines. Whether you should drink more fluids. This will help to remove the tracer from your body. Ask your doctor, or the department that is doing the test: When will my results be ready? How will I get my results? What are my treatment options? What other tests do I need? What are my next steps? This information is not intended to replace advice given to you by your health care provider. Make sure you discuss any  questions you have with your health care provider. Document Revised: 11/06/2021 Document Reviewed: 11/06/2021 Elsevier Patient Education  2023 Else[employer] Inc.  Echocardiogram An echocardiogram is a test that uses sound waves (ultrasound) to produce images of the heart. Images from an echocardiogram can provide important information about: Heart size and shape. The size and thickness and movement of your heart's walls. Heart muscle function and strength. Heart valve function or if you have stenosis. Stenosis is when the heart valves are too narrow. If blood is flowing backward through the heart valves (regurgitation). A tumor or infectious growth around the heart valves. Areas of heart muscle that are not working well because of poor blood flow or injury from a heart attack. Aneurysm detection. An aneurysm is a weak or damaged part of an artery wall. The wall bulges out from the normal force of blood pumping through the body. Tell a health care provider about: Any allergies you have. All medicines you are taking, including vitamins, herbs, eye drops, creams, and over-the-counter medicines. Any blood disorders you have. Any surgeries you have had. Any medical conditions you have. Whether you are pregnant or may be pregnant. What are the risks? Generally, this is a safe test. However, problems may occur, including an allergic reaction to dye (contrast) that may be used during the test. What happens before the test? No specific preparation is needed. You may eat and drink normally. What happens during the test?  You will take off your clothes from the waist up and put on a hospital gown. Electrodes or electrocardiogram (ECG)patches may be placed on your chest. The electrodes or patches are then connected to a device that monitors your heart rate and rhythm. You will lie down on a table for an ultrasound exam. A gel will be applied to your chest to help sound waves pass through your skin. A  handheld device, called a transducer, will be pressed against your chest and moved over your heart. The transducer produces sound waves that travel to your heart and bounce back (or "echo" back) to the transducer. These sound waves will be captured in real-time and changed into images of your heart that can be viewed on a video monitor. The images will be recorded on a computer and reviewed by your health care provider. You may be asked to change positions or hold your breath for a short time. This makes it easier to get different views or better views of your heart. In some cases, you may receive contrast through an IV in one of your veins. This can improve the quality of the pictures from your heart. The procedure may vary among health care providers and hospitals. What can I expect  after the test? You may return to your normal, everyday life, including diet, activities, and medicines, unless your health care provider tells you not to do that. Follow these instructions at home: It is up to you to get the results of your test. Ask your health care provider, or the department that is doing the test, when your results will be ready. Keep all follow-up visits. This is important. Summary An echocardiogram is a test that uses sound waves (ultrasound) to produce images of the heart. Images from an echocardiogram can provide important information about the size and shape of your heart, heart muscle function, heart valve function, and other possible heart problems. You do not need to do anything to prepare before this test. You may eat and drink normally. After the echocardiogram is completed, you may return to your normal, everyday life, unless your health care provider tells you not to do that. This information is not intended to replace advice given to you by your health care provider. Make sure you discuss any questions you have with your health care provider. Document Revised: 02/21/2021 Document  Reviewed: 02/01/2020 Elsevier Patient Education  2023 Elsevier Inc.    Important Information About Sugar

## 2023-06-20 NOTE — Assessment & Plan Note (Signed)
Progressive shortness of breath over the past couple months. Could be anginal equivalent given her cardiovascular risk factors. Further evaluate with transthoracic echocardiogram and Lexiscan stress test.

## 2023-06-20 NOTE — Addendum Note (Signed)
Addended by: Lonia Farber on: 06/20/2023 11:51 AM   Modules accepted: Orders

## 2023-06-20 NOTE — Assessment & Plan Note (Signed)
Well-controlled. Target below 130 over 80 mmHg. Continue with her current medications chlorthalidone 25 mg once daily Telmisartan 80 mg once daily Metoprolol succinate 25 mg once daily.

## 2023-06-20 NOTE — Assessment & Plan Note (Signed)
Chest pain atypical and appears noncardiac in description. However she does have shortness of breath that is been slowly progressive over the past couple months, and she does have risk factors.  In the setting of progressive dyspnea on exertion, and atypical chest pain symptoms and risk factors, further evaluate with transthoracic echocardiogram and Lexiscan stress test with nuclear imaging.

## 2023-07-07 ENCOUNTER — Telehealth: Payer: Self-pay

## 2023-07-07 ENCOUNTER — Telehealth: Payer: Self-pay | Admitting: Emergency Medicine

## 2023-07-07 ENCOUNTER — Telehealth: Payer: Self-pay | Admitting: Internal Medicine

## 2023-07-07 NOTE — Telephone Encounter (Signed)
-----   Message from Alean SAUNDERS Madireddy sent at 07/07/2023  4:19 PM EST ----- Regarding: 7 d event monitor from PCP office results reviewed. Please inform her that the heart monitor results from 06/06/2023 for 7 days was reviewed and shows average heart rate 66/min.  No significant extra beats from top or bottom chambers of the heart. At times and mostly during sleeping hours there appears to be slowing down of conduction between the top and bottom chambers of the heart [called type I second-degree AV block]. The longest pause because of this between beat to beat was about 2.1 seconds, which is considered not significant [3 seconds and above is typically considered significant].  The symptoms that she activated the device for on couple occasions, correlated with normal heart rate and rhythm.  Overall these are reassuring results and show no major abnormalities.  Given slowing down of the conduction during the nighttime I would recommend her to discontinue metoprolol  succinate. I will review the results from echocardiogram and stress test once available, these appear to be scheduled for February 4.  Thank you

## 2023-07-07 NOTE — Telephone Encounter (Signed)
 Correction not [facility] [facility] center, But [facility]

## 2023-07-07 NOTE — Telephone Encounter (Signed)
 Called and spoke to patient and reviewed  monitor results. Patient verbalized understanding regarding monitor results. Reviewed Dr. Madireddy's recommendation to discontinue metoprolol . Patient stated that she has been on this for her Migraines. Dr. Liborio made aware and stated that patient could continue taking medication but if she became symptomatic  to let our office know and discontinue the Metoprolol . Reviewed this with patient and she verbalized understanding. Patient then asked if she still should get her Echo and Lexi scan done. Advised patient that it is Dr. Madireddy's recommendation to get the test done. Patient was agreeable with keeping the appointment and having the test done. She had no further questions.

## 2023-07-07 NOTE — Telephone Encounter (Signed)
 Informed Heather Liu from [facility] that we received the monitor results that were faxed.

## 2023-07-07 NOTE — Telephone Encounter (Signed)
 [facility] is calling to see if we have received stat monitor results they faxed [place]. Please advise

## 2023-07-07 NOTE — Telephone Encounter (Signed)
 Melissa at [facility] STAT fax coming through on patient's heart monitor Phone number is [phone] Please keep on the lookout for STAT fax

## 2023-07-17 ENCOUNTER — Telehealth: Payer: Self-pay | Admitting: *Deleted

## 2023-07-17 NOTE — Telephone Encounter (Signed)
Gave instructions for MPI study.

## 2023-07-25 ENCOUNTER — Telehealth: Payer: Self-pay

## 2023-07-25 NOTE — Telephone Encounter (Signed)
Pt is calling to r/s Echo and Myoview. The test are linked and I was unable to r/s. Can you help me to get these r/s?

## 2023-07-25 NOTE — Telephone Encounter (Signed)
Called patient and rescheduled tests./kbl 07/25/23

## 2023-07-29 ENCOUNTER — Other Ambulatory Visit: Payer: Federal, State, Local not specified - PPO

## 2023-07-30 NOTE — Telephone Encounter (Signed)
 Spoke with patient and she was given detailed instructions about her STRESS TEST on 08/07/23 at 7:45.

## 2023-08-07 ENCOUNTER — Ambulatory Visit: Payer: Federal, State, Local not specified - PPO

## 2023-08-07 DIAGNOSIS — R079 Chest pain, unspecified: Secondary | ICD-10-CM | POA: Diagnosis not present

## 2023-08-07 LAB — MYOCARDIAL PERFUSION IMAGING
LV dias vol: 88 mL (ref 46–106)
LV sys vol: 24 mL
Nuc Stress EF: 73 %
Peak HR: 84 {beats}/min
Rest HR: 56 {beats}/min
Rest Nuclear Isotope Dose: 10.7 mCi
SDS: 2
SRS: 5
SSS: 7
Stress Nuclear Isotope Dose: 32.4 mCi
TID: 0.97

## 2023-08-07 MED ORDER — TECHNETIUM TC 99M TETROFOSMIN IV KIT
32.7000 | PACK | Freq: Once | INTRAVENOUS | Status: AC | PRN
  Administered 2023-08-07: 32.4 via INTRAVENOUS

## 2023-08-07 MED ORDER — REGADENOSON 0.4 MG/5ML IV SOLN
0.4000 mg | Freq: Once | INTRAVENOUS | Status: AC
  Administered 2023-08-07: 0.4 mg via INTRAVENOUS

## 2023-08-07 MED ORDER — TECHNETIUM TC 99M TETROFOSMIN IV KIT
10.7000 | PACK | Freq: Once | INTRAVENOUS | Status: AC | PRN
  Administered 2023-08-07: 10.7 via INTRAVENOUS

## 2023-08-07 NOTE — Progress Notes (Signed)
Please inform her the results from the Lexiscan stress test are reassuring with no significant concern for reduced blood flow to the heart muscle.  Overall this was a low risk study with no major abnormalities. I will review the results from echocardiogram once available.

## 2023-08-08 ENCOUNTER — Telehealth: Payer: Self-pay | Admitting: Emergency Medicine

## 2023-08-08 NOTE — Telephone Encounter (Signed)
-----   Message from Idalou R Madireddy sent at 08/07/2023  7:13 PM EST ----- Please inform her the results from the Lexiscan stress test are reassuring with no significant concern for reduced blood flow to the heart muscle.  Overall this was a low risk study with no major abnormalities. I will review the results from echocardiogram once available.

## 2023-08-08 NOTE — Telephone Encounter (Signed)
Results reviewed with pt as per Dr. Madireddy's note.  Pt verbalized understanding and had no additional questions. Routed to PCP

## 2023-08-15 ENCOUNTER — Ambulatory Visit: Payer: Federal, State, Local not specified - PPO

## 2023-08-15 DIAGNOSIS — R079 Chest pain, unspecified: Secondary | ICD-10-CM | POA: Diagnosis not present

## 2023-08-15 DIAGNOSIS — I351 Nonrheumatic aortic (valve) insufficiency: Secondary | ICD-10-CM | POA: Diagnosis not present

## 2023-08-15 DIAGNOSIS — I10 Essential (primary) hypertension: Secondary | ICD-10-CM

## 2023-08-15 LAB — ECHOCARDIOGRAM COMPLETE
Area-P 1/2: 3.1 cm2
P 1/2 time: 516 ms
S' Lateral: 3.6 cm

## 2023-08-21 ENCOUNTER — Telehealth: Payer: Self-pay | Admitting: Emergency Medicine

## 2023-08-21 NOTE — Telephone Encounter (Signed)
 Spoke with patient and reviewed results as per Dr. Anna Genre note. She verbalized understanding and had no additional questions.

## 2023-08-21 NOTE — Telephone Encounter (Signed)
 Left voicemail for patient to return call.

## 2023-08-21 NOTE — Telephone Encounter (Signed)
-----   Message from Shallowater R Madireddy sent at 08/16/2023  8:52 AM EST ----- Please inform her results from the echocardiogram show normal pumping function of the heart, normal relaxation function of the heart.  There are no significant valve abnormalities reported.  Overall this is good news and reassuring.  Will review further at her next office visit.  Please forward results to her PCP to.  Thank you Thank you.

## 2023-09-18 ENCOUNTER — Ambulatory Visit: Payer: Federal, State, Local not specified - PPO

## 2023-09-18 VITALS — BP 104/72 | HR 60 | Ht 66.0 in | Wt 168.0 lb

## 2023-09-18 DIAGNOSIS — I10 Essential (primary) hypertension: Secondary | ICD-10-CM

## 2023-09-18 NOTE — Progress Notes (Signed)
 Cardiology Consultation:    Date:  09/18/2023   ID:  Heather Liu, DOB 1966/04/19, MRN [id number]  PCP:  Heather Mage, NP  Cardiologist:  Heather Murphy, MD   Referring MD: Heather Mage, NP   No chief complaint on file.    ASSESSMENT AND PLAN:   Heather Liu 58 year old woman with prediabetes, hypertension hyperlipidemia, smokes tobacco, migraine here for follow-up visit.  No significant abnormalities on recent stress test with nuclear imaging from February 2025 and echocardiogram from February 2025 showing normal biventricular function without any significant valve abnormalities.   Problem List Items Addressed This Visit     Essential hypertension - Primary   Well-controlled.  Target below 130/80 mmHg. Continue current medication chlorthalidone half a tablet of 25 mg once daily which she is currently taking at home and telmisartan 80 mg once daily. She is on metoprolol succinate 25 mg once daily essentially for migraine symptoms and wants to continue doing this.  From cardiac standpoint reviewed her event monitor findings once again and type I second-degree AV block noted during mostly sleeping hours not associated with any symptoms.  In this setting discussed the prior recommendation of discontinuing metoprolol.  She acknowledges good result but given overall benign nature of the event monitor findings and longstanding health she is finding with migraine symptoms she prefers to continue with metoprolol at this time.  I think this is reasonable.  Advised her if she notices any symptoms of lightheadedness or dizziness to notify us and discontinue metoprolol.      Return to clinic for follow-up as needed.   History of Present Illness:    Heather Liu is a 58 y.o. female who is being seen today for follow-up visit. Last visit with me in the office was 06-20-2023. PCP is Tetter, Alma Downs, NP.  Has history of prediabetes, hypertension, hyperlipidemia, smokes  tobacco, migraine, family history of aortic aneurysm evaluated for chest pain at last office visit.  Event monitor 7 days from 06-06-2023 through 06-13-2023 done through PCPs request, reviewed scan results sent to my office for review. Reports predominantly sinus rhythm with average heart rate 66/min [ranging from 43 bpm to 101 bpm]. No ventricular or supraventricular ectopic beats were reported. There were frequent instances of type I second-degree AV block [Mobitz type I] noted, with the longest pause being 2.1 seconds on day 2 at 2:05 AM.   There were 2 patient triggered events during the monitoring and correlated with sinus rhythm with normal heart rates. Overall no major abnormalities.   Most of the type I second-degree AV block appeared to be during sleeping hours. In the setting of these findings, recommend discontinuing metoprolol succinate.  Echocardiogram 08-15-2023 completed showed normal biventricular function with LVEF 60 to 65%, GLS -19.4%,  Stress test with nuclear imaging 08-07-2023 was low risk study without ischemia.  Mentions overall doing well at home.  Keeps busy with family activities with her children and grandkids.  Denies any symptoms of chest pain, shortness of breath, orthopnea, paroxysmal nocturnal dyspnea.  No palpitation, lightheadedness, dizziness or syncopal episodes.  Taking her medications regularly.  Essentially continues to take metoprolol succinate 25 mg once daily.  Because it helps her with her migraine symptoms and has been doing this for over 20 years.  Blood pressures at home well-controlled typically with systolic between 409W to 130s. Denies any dizziness, lightheadedness, palpitations.   Past Medical History:  Diagnosis Date   Chest pain 06/20/2023   Current every day  smoker 06/08/2018   Disorder of urinary tract 06/17/2023   Erosion of vaginal mesh (HCC) 2017   sling placed 2015   Essential hypertension 06/07/2018   Hyperlipidemia     Hypertension    Incontinence 06/17/2023   Increased frequency of urination 06/17/2023   Irregular periods 06/17/2023   Menopausal symptom 06/17/2023   Pain in pelvis 06/17/2023   S/P vaginal hysterectomy 02/21/2014   Shortness of breath 06/20/2023    Past Surgical History:  Procedure Laterality Date   ANTERIOR AND POSTERIOR REPAIR N/A 02/21/2014   Procedure: ANTERIOR (CYSTOCELE) REPAIR ;  Surgeon: Essie Hart, MD;  Location: [facility];  Service: Gynecology;  Laterality: N/A;   BLADDER SUSPENSION N/A 02/21/2014   Procedure: TRANSVAGINAL TAPE (TVT) PROCEDURE WITH CYSTO;  Surgeon: Essie Hart, MD;  Location: [facility];  Service: Gynecology;  Laterality: N/A;   BLADDER SUSPENSION N/A 11/09/2015   Procedure: REPAIR WITH COLOPLAST AXIS DERMIS**;  Surgeon: Jethro Bolus, MD;  Location: [facility];  Service: Urology;  Laterality: N/A;   DILATION AND CURETTAGE OF UTERUS     SAB   TRANSVAGINAL TAPE (TVT) REMOVAL N/A 11/09/2015   Procedure: *REMOVE OF TVT VAGINAL TAPE AND REPAIR WITH COLOPLAST AXIS DERMIS**;  Surgeon: Jethro Bolus, MD;  Location: [facility];  Service: Urology;  Laterality: N/A;   VAGINAL HYSTERECTOMY N/A 02/21/2014   Procedure: HYSTERECTOMY VAGINAL;  Surgeon: Essie Hart, MD;  Location: [facility];  Service: Gynecology;  Laterality: N/A;    Current Medications: Current Meds  Medication Sig   chlorthalidone (HYGROTON) 25 MG tablet Take 1 tablet by mouth daily.   Cholecalciferol (VITAMIN D-3) 5000 UNITS TABS Take 1 capsule by mouth daily.    metoprolol succinate (TOPROL-XL) 25 MG 24 hr tablet Take 1 tablet (25 mg total) by mouth daily. PLEASE CALL OFFICE TO SCHEDULE APPOINTMENT FOR FURTHER REFILLS   Multiple Vitamins-Minerals (MULTIVITAMIN PO) Take 2 tablets by mouth daily.   NON FORMULARY Take 1 tablet by mouth daily. Prolent   telmisartan (MICARDIS) 80 MG tablet Take 1 tablet (80 mg total) by mouth daily. PLEASE CALL OFFICE TO SCHEDULE APPOINTMENT FOR  FURTHER REFILLS     Allergies:   Codeine, Morphine and codeine, Other, and Sulfa antibiotics   Social History   Socioeconomic History   Marital status: Married    Spouse name: Not on file   Number of children: Not on file   Years of education: Not on file   Highest education level: Not on file  Occupational History   Not on file  Tobacco Use   Smoking status: Every Day    Current packs/day: 1.00    Average packs/day: 1 pack/day for 35.0 years (35.0 ttl pk-yrs)    Types: Cigarettes   Smokeless tobacco: Never  Vaping Use   Vaping status: Never Used  Substance and Sexual Activity   Alcohol use: No   Drug use: No   Sexual activity: Not on file  Other Topics Concern   Not on file  Social History Narrative   Not on file   Social Drivers of Health   Financial Resource Strain: Not on file  Food Insecurity: Not on file  Transportation Needs: Not on file  Physical Activity: Not on file  Stress: Not on file  Social Connections: Not on file     Family History: The patient's family history includes Heart attack in her paternal grandfather and sister; Hypertension in her father and mother; Pancreatic cancer in her father; Stroke in her maternal  aunt, maternal grandmother, and maternal uncle. ROS:   Please see the history of present illness.    All 14 point review of systems negative except as described per history of present illness.  EKGs/Labs/Other Studies Reviewed:    The following studies were reviewed today:   EKG:       Recent Labs: No results found for requested labs within last 365 days.  Recent Lipid Panel    Component Value Date/Time   CHOL 223 (H) 06/08/2018 0922   TRIG 97 06/08/2018 0922   HDL 44 06/08/2018 0922   CHOLHDL 5.1 (H) 06/08/2018 0922   LDLCALC 160 (H) 06/08/2018 1610    Physical Exam:    VS:  BP 104/72   Pulse 60   Ht 5\' 6"  (1.676 m)   Wt 168 lb (76.2 kg)   SpO2 95%   BMI 27.12 kg/m     Wt Readings from Last 3 Encounters:   09/18/23 168 lb (76.2 kg)  08/07/23 163 lb (73.9 kg)  06/20/23 163 lb 12.8 oz (74.3 kg)     GENERAL:  Well nourished, well developed in no acute distress NECK: No JVD; No carotid bruits CARDIAC: RRR, S1 and S2 present, no murmurs, no rubs, no gallops Extremities: No pitting pedal edema. Pulses bilaterally symmetric with radial 2+ and dorsalis pedis 2+ NEUROLOGIC:  Alert and oriented x 3  Medication Adjustments/Labs and Tests Ordered: Current medicines are reviewed at length with the patient today.  Concerns regarding medicines are outlined above.  No orders of the defined types were placed in this encounter.  No orders of the defined types were placed in this encounter.   Signed, Cecille Amsterdam, MD, MPH, [facility]. 09/18/2023 8:36 AM    [facility]

## 2023-09-18 NOTE — Assessment & Plan Note (Signed)
 Well-controlled.  Target below 130/80 mmHg. Continue current medication chlorthalidone half a tablet of 25 mg once daily which she is currently taking at home and telmisartan 80 mg once daily. She is on metoprolol succinate 25 mg once daily essentially for migraine symptoms and wants to continue doing this.  From cardiac standpoint reviewed her event monitor findings once again and type I second-degree AV block noted during mostly sleeping hours not associated with any symptoms.  In this setting discussed the prior recommendation of discontinuing metoprolol.  She acknowledges good result but given overall benign nature of the event monitor findings and longstanding health she is finding with migraine symptoms she prefers to continue with metoprolol at this time.  I think this is reasonable.  Advised her if she notices any symptoms of lightheadedness or dizziness to notify us and discontinue metoprolol.

## 2023-09-18 NOTE — Patient Instructions (Signed)
Medication Instructions:  Your physician recommends that you continue on your current medications as directed. Please refer to the Current Medication list given to you today.  *If you need a refill on your cardiac medications before your next appointment, please call your pharmacy*   Lab Work: None If you have labs (blood work) drawn today and your tests are completely normal, you will receive your results only by: MyChart Message (if you have MyChart) OR A paper copy in the mail If you have any lab test that is abnormal or we need to change your treatment, we will call you to review the results.   Testing/Procedures: None   Follow-Up: At [facility], you and your health needs are our priority.  As part of our continuing mission to provide you with exceptional heart care, we have created designated Provider Care Teams.  These Care Teams include your primary Cardiologist (physician) and Advanced Practice Providers (APPs -  Physician Assistants and Nurse Practitioners) who all work together to provide you with the care you need, when you need it.  We recommend signing up for the patient portal called "MyChart".  Sign up information is provided on this After Visit Summary.  MyChart is used to connect with patients for Virtual Visits (Telemedicine).  Patients are able to view lab/test results, encounter notes, upcoming appointments, etc.  Non-urgent messages can be sent to your provider as well.   To learn more about what you can do with MyChart, go to [email or web address].    Your next appointment:   Follow up as needed  Provider:   Huntley Dec, MD    Other Instructions None

## 2023-09-26 ENCOUNTER — Encounter: Payer: Self-pay | Admitting: Podiatry

## 2023-09-26 ENCOUNTER — Ambulatory Visit: Admitting: Podiatry

## 2023-09-26 DIAGNOSIS — L600 Ingrowing nail: Secondary | ICD-10-CM

## 2023-09-26 MED ORDER — CICLOPIROX 8 % EX SOLN: Freq: Every day | CUTANEOUS | 0 refills | Status: DC

## 2023-09-26 NOTE — Progress Notes (Signed)
 Subjective:  Patient ID: Heather Liu, female    DOB: 07-18-1965,  MRN: [id number]  Chief Complaint  Patient presents with   Toe Pain    Hallux bilateral - ingrown both borders, concerned about how pointed they are, tender for several months, tried cutting-no help   New Patient (Initial Visit)    58 y.o. female presents with the above complaint.  Patient presents with right hallux medial border ingrown painful to touch has progressively gotten worse over has progressive gotten worse would like to have it removed denies any other acute issues pain scale 7 out of 10 dull aching nature   Review of Systems: Negative except as noted in the HPI. Denies N/V/F/Ch.  Past Medical History:  Diagnosis Date   Chest pain 06/20/2023   Current every day smoker 06/08/2018   Disorder of urinary tract 06/17/2023   Erosion of vaginal mesh (HCC) 2017   sling placed 2015   Essential hypertension 06/07/2018   Hyperlipidemia    Hypertension    Incontinence 06/17/2023   Increased frequency of urination 06/17/2023   Irregular periods 06/17/2023   Menopausal symptom 06/17/2023   Pain in pelvis 06/17/2023   S/P vaginal hysterectomy 02/21/2014   Shortness of breath 06/20/2023    Current Outpatient Medications:    ciclopirox (PENLAC) 8 % solution, Apply topically at bedtime. Apply over nail and surrounding skin. Apply daily over previous coat. After seven (7) days, may remove with alcohol and continue cycle., Disp: 6.6 mL, Rfl: 0   chlorthalidone (HYGROTON) 25 MG tablet, Take 1 tablet by mouth daily., Disp: , Rfl:    Cholecalciferol (VITAMIN D-3) 5000 UNITS TABS, Take 1 capsule by mouth daily. , Disp: , Rfl:    metoprolol succinate (TOPROL-XL) 25 MG 24 hr tablet, Take 1 tablet (25 mg total) by mouth daily. PLEASE CALL OFFICE TO SCHEDULE APPOINTMENT FOR FURTHER REFILLS, Disp: 15 tablet, Rfl: 0   Multiple Vitamins-Minerals (MULTIVITAMIN PO), Take 2 tablets by mouth daily., Disp: , Rfl:    NON  FORMULARY, Take 1 tablet by mouth daily. Prolent, Disp: , Rfl:    telmisartan (MICARDIS) 80 MG tablet, Take 1 tablet (80 mg total) by mouth daily. PLEASE CALL OFFICE TO SCHEDULE APPOINTMENT FOR FURTHER REFILLS, Disp: 15 tablet, Rfl: 0  Social History   Tobacco Use  Smoking Status Every Day   Current packs/day: 1.00   Average packs/day: 1 pack/day for 35.0 years (35.0 ttl pk-yrs)   Types: Cigarettes  Smokeless Tobacco Never    Allergies  Allergen Reactions   Codeine Other (See Comments)    Burning of tongue   Morphine And Codeine Itching   Other Hives   Sulfa Antibiotics Hives and Rash   Objective:  There were no vitals filed for this visit. There is no height or weight on file to calculate BMI. Constitutional Well developed. Well nourished.  Vascular Dorsalis pedis pulses palpable bilaterally. Posterior tibial pulses palpable bilaterally. Capillary refill normal to all digits.  No cyanosis or clubbing noted. Pedal hair growth normal.  Neurologic Normal speech. Oriented to person, place, and time. Epicritic sensation to light touch grossly present bilaterally.  Dermatologic Painful ingrowing nail at medial nail borders of the hallux nail right. No other open wounds. No skin lesions.  Orthopedic: Normal joint ROM without pain or crepitus bilaterally. No visible deformities. No bony tenderness.   Radiographs: None Assessment:  No diagnosis found. Plan:  Patient was evaluated and treated and all questions answered.  Ingrown Nail, right -Patient elects to proceed  with minor surgery to remove ingrown toenail removal today. Consent reviewed and signed by patient. -Ingrown nail excised. See procedure note. -Educated on post-procedure care including soaking. Written instructions provided and reviewed. -Patient to follow up in 2 weeks for nail check.  Procedure: Excision of Ingrown Toenail Location: Right 1st toe medial nail borders. Anesthesia: Lidocaine 1% plain; 1.5 mL  and Marcaine 0.5% plain; 1.5 mL, digital block. Skin Prep: Betadine. Dressing: Silvadene; telfa; dry, sterile, compression dressing. Technique: Following skin prep, the toe was exsanguinated and a tourniquet was secured at the base of the toe. The affected nail border was freed, split with a nail splitter, and excised. Chemical matrixectomy was then performed with phenol and irrigated out with alcohol. The tourniquet was then removed and sterile dressing applied. Disposition: Patient tolerated procedure well. Patient to return in 2 weeks for follow-up.   No follow-ups on file.

## 2023-09-26 NOTE — Patient Instructions (Signed)

## 2024-04-05 ENCOUNTER — Ambulatory Visit

## 2024-04-26 ENCOUNTER — Ambulatory Visit

## 2024-05-06 ENCOUNTER — Ambulatory Visit

## 2024-05-06 VITALS — BP 160/80 | HR 56 | Ht 66.0 in | Wt 165.2 lb

## 2024-05-06 DIAGNOSIS — F17200 Nicotine dependence, unspecified, uncomplicated: Secondary | ICD-10-CM | POA: Diagnosis not present

## 2024-05-06 DIAGNOSIS — I10 Essential (primary) hypertension: Secondary | ICD-10-CM

## 2024-05-06 DIAGNOSIS — E782 Mixed hyperlipidemia: Secondary | ICD-10-CM | POA: Diagnosis not present

## 2024-05-06 MED ORDER — CHLORTHALIDONE 25 MG PO TABS: 25.0000 mg | ORAL_TABLET | Freq: Every day | ORAL | 3 refills | Status: AC

## 2024-05-06 NOTE — Assessment & Plan Note (Signed)
 Recommended once again about harmful effects of smoking and strongly recommended to quit. She mentions she will work on this again in willing to cut down.  Reviewed options for nicotine gum, nicotine patches and pharmacotherapy to reduce cravings.

## 2024-05-06 NOTE — Assessment & Plan Note (Addendum)
 Suboptimal. Lipid panel from 03/18/2024 reviewed total cholesterol 236, LDL 164, HDL 42, triglycerides 154.  Recommended importance of lipid control. Overall cardiovascular risk from uncontrolled lipid levels discussed. She acknowledges and is willing to work on dietary modifications.  Pharmacotherapy for lipid management reviewed. She is hesitant to start any statins at this time. Will review fasting lipid panel prior to next visit tentatively in 3 months to assess for any significant change with dietary modifications.  If LDL remains above 130 would recommend starting statins.  Recommended daily exercise up to 30 minutes of aerobic activity 5-6 times a week.

## 2024-05-06 NOTE — Assessment & Plan Note (Signed)
 Uncontrolled. Target blood pressure below 130/80 mmHg.  Previously took chlorthalidone but due to muscle cramps which she felt were drug related she stopped the medication.  Muscle cramps have continued despite being off chlorthalidone for several months. Agreeable to resume the medication.  Restart chlorthalidone 25 mg once daily Continue telmisartan  80 mg once daily On low-dose metoprolol  XL 25 mg once daily essentially for her migraine symptoms.

## 2024-05-06 NOTE — Progress Notes (Signed)
 Cardiology Consultation:    Date:  05/06/2024   ID:  Heather Liu, DOB August 28, 1965, MRN [id number]  PCP:  Nestor Elston NOVAK, NP  Cardiologist:  Alean JONELLE Kobus, MD   Referring MD: Nestor Elston NOVAK, NP   No chief complaint on file.    ASSESSMENT AND PLAN:   Heather Liu 58 year old   history of prediabetes, hypertension hyperlipidemia, smokes tobacco, migraine here for follow-up visit.  No significant abnormalities on recent stress test with nuclear imaging from February 2025 and echocardiogram from February 2025 showing normal biventricular function without any significant valve abnormalities.  Occasional type I second-degree AV block noted during sleeping hours on event monitor 7-day study from December 2024 done through PCPs office.  Here for routine follow-up.  Blood pressures are uncontrolled here in the office, she continues to smoke cigarettes and relatively sedentary with her activities.    Problem List Items Addressed This Visit     Hypertension - Primary   Uncontrolled. Target blood pressure below 130/80 mmHg.  Previously took chlorthalidone but due to muscle cramps which she felt were drug related she stopped the medication.  Muscle cramps have continued despite being off chlorthalidone for several months. Agreeable to resume the medication.  Restart chlorthalidone 25 mg once daily Continue telmisartan  80 mg once daily On low-dose metoprolol  XL 25 mg once daily essentially for her migraine symptoms.      Hyperlipidemia   Suboptimal. Lipid panel from 03/18/2024 reviewed total cholesterol 236, LDL 164, HDL 42, triglycerides 154.  Recommended importance of lipid control. Overall cardiovascular risk from uncontrolled lipid levels discussed. She acknowledges and is willing to work on dietary modifications.  Pharmacotherapy for lipid management reviewed. She is hesitant to start any statins at this time. Will review fasting lipid panel prior to next visit  tentatively in 3 months to assess for any significant change with dietary modifications.  If LDL remains above 130 would recommend starting statins.  Recommended daily exercise up to 30 minutes of aerobic activity 5-6 times a week.       Current every day smoker   Recommended once again about harmful effects of smoking and strongly recommended to quit. She mentions she will work on this again in willing to cut down.  Reviewed options for nicotine gum, nicotine patches and pharmacotherapy to reduce cravings.       Return to clinic tentatively in 3 months.   History of Present Illness:    Heather Liu is a 58 y.o. female who is being seen today for follow-up visit. PCP is Tetter, Elston NOVAK, NP. Last visit with me in the office was 09/18/2023.  Pleasant woman here for the visit by herself.  Lives with her husband.  Has a court from [place] and is planning on retirement at the end of December.  Has a history of prediabetes, hypertension hyperlipidemia, smokes tobacco, migraine here for follow-up visit.  No significant abnormalities on recent stress test with nuclear imaging from February 2025 and echocardiogram from February 2025 showing normal biventricular function without any significant valve abnormalities.  Occasional type I second-degree AV block noted during sleeping hours on event monitor 7-day study from December 2024 done through PCPs office.  Recent blood work 03/18/2024 with lipid panel total cholesterol 236, triglycerides 154, HDL 42, LDL 164. Hemoglobin A1c 5.6. CMP notes normal transaminases and alkaline phosphatase.  Mentions overall doing well no active cardiac symptoms.  Denies any chest pain, shortness of breath, orthopnea, paroxysmal nocturnal dyspnea.  No  pedal edema.  No claudication-like symptoms.  Does have superficial prominent veins bilateral lower extremities which at times are uncomfortable.  Mentions blood pressures at home typically systolic 130s. Here in  the office today blood pressures are elevated. She does have an upper arm blood pressure cuff at home and is going to keep a log and recommended she have the device complete at PCPs office or at our office when possible in the next few weeks.  Occasional dizziness on sudden turning actions of her head.  No falls or syncopal episode.  No lightheadedness.  Has not been physically active.  Relatively sedentary is with her work.  Continues to smoke half pack to a pack of cigarettes a day. No alcohol use.  No recreational drug use.  Past Medical History:  Diagnosis Date   Chest pain 06/20/2023   Current every day smoker 06/08/2018   Disorder of urinary tract 06/17/2023   Erosion of vaginal mesh 2017   sling placed 2015   Essential hypertension 06/07/2018   Hyperlipidemia    Hypertension    Incontinence 06/17/2023   Increased frequency of urination 06/17/2023   Irregular periods 06/17/2023   Menopausal symptom 06/17/2023   Pain in pelvis 06/17/2023   S/P vaginal hysterectomy 02/21/2014   Shortness of breath 06/20/2023    Past Surgical History:  Procedure Laterality Date   ANTERIOR AND POSTERIOR REPAIR N/A 02/21/2014   Procedure: ANTERIOR (CYSTOCELE) REPAIR ;  Surgeon: Gigi Botts, MD;  Location: [facility];  Service: Gynecology;  Laterality: N/A;   BLADDER SUSPENSION N/A 02/21/2014   Procedure: TRANSVAGINAL TAPE (TVT) PROCEDURE WITH CYSTO;  Surgeon: Gigi Botts, MD;  Location: [facility];  Service: Gynecology;  Laterality: N/A;   BLADDER SUSPENSION N/A 11/09/2015   Procedure: REPAIR WITH COLOPLAST AXIS DERMIS**;  Surgeon: Arlena Gal, MD;  Location: [facility];  Service: Urology;  Laterality: N/A;   DILATION AND CURETTAGE OF UTERUS     SAB   TRANSVAGINAL TAPE (TVT) REMOVAL N/A 11/09/2015   Procedure: *REMOVE OF TVT VAGINAL TAPE AND REPAIR WITH COLOPLAST AXIS DERMIS**;  Surgeon: Arlena Gal, MD;  Location: [facility] ;  Service: Urology;  Laterality:  N/A;   VAGINAL HYSTERECTOMY N/A 02/21/2014   Procedure: HYSTERECTOMY VAGINAL;  Surgeon: Gigi Botts, MD;  Location: [facility];  Service: Gynecology;  Laterality: N/A;    Current Medications: Current Meds  Medication Sig   metoprolol  succinate (TOPROL -XL) 25 MG 24 hr tablet Take 1 tablet (25 mg total) by mouth daily. PLEASE CALL OFFICE TO SCHEDULE APPOINTMENT FOR FURTHER REFILLS   Multiple Vitamins-Minerals (MULTIVITAMIN PO) Take 2 tablets by mouth daily.   NON FORMULARY Take 1 tablet by mouth daily. Prolent   telmisartan  (MICARDIS ) 80 MG tablet Take 1 tablet (80 mg total) by mouth daily. PLEASE CALL OFFICE TO SCHEDULE APPOINTMENT FOR FURTHER REFILLS   [DISCONTINUED] chlorthalidone (HYGROTON) 25 MG tablet Take 1 tablet by mouth daily.     Allergies:   Codeine, Morphine and codeine, Other, and Sulfa antibiotics   Social History   Socioeconomic History   Marital status: Married    Spouse name: Not on file   Number of children: Not on file   Years of education: Not on file   Highest education level: Not on file  Occupational History   Not on file  Tobacco Use   Smoking status: Every Day    Current packs/day: 1.00    Average packs/day: 1 pack/day for 35.0 years (35.0 ttl pk-yrs)    Types:  Cigarettes   Smokeless tobacco: Never  Vaping Use   Vaping status: Never Used  Substance and Sexual Activity   Alcohol use: No   Drug use: No   Sexual activity: Not on file  Other Topics Concern   Not on file  Social History Narrative   Not on file   Social Drivers of Health   Financial Resource Strain: Not on file  Food Insecurity: Low Risk  (03/18/2024)   Received from [facility]   Hunger Vital Sign    Within the past 12 months, you worried that your food would run out before you got money to buy more: Never true    Within the past 12 months, the food you bought just didn't last and you didn't have money to get more. : Never true  Transportation Needs: No Transportation Needs  (03/18/2024)   Received from [facility]    In the past 12 months, has lack of reliable transportation kept you from medical appointments, meetings, work or from getting things needed for daily living? : No  Physical Activity: Not on file  Stress: Not on file  Social Connections: Not on file     Family History: The patient's family history includes Heart attack in her paternal grandfather and sister; Hypertension in her father and mother; Pancreatic cancer in her father; Stroke in her maternal aunt, maternal grandmother, and maternal uncle. ROS:   Please see the history of present illness.    All 14 point review of systems negative except as described per history of present illness.  EKGs/Labs/Other Studies Reviewed:    The following studies were reviewed today:   EKG:       Recent Labs: No results found for requested labs within last 365 days.  Recent Lipid Panel    Component Value Date/Time   CHOL 223 (H) 06/08/2018 0922   TRIG 97 06/08/2018 0922   HDL 44 06/08/2018 0922   CHOLHDL 5.1 (H) 06/08/2018 0922   LDLCALC 160 (H) 06/08/2018 9077    Physical Exam:    VS:  BP (!) 158/76   Pulse (!) 56   Ht 5' 6 (1.676 m)   Wt 165 lb 3.2 oz (74.9 kg)   SpO2 94%   BMI 26.66 kg/m     Wt Readings from Last 3 Encounters:  05/06/24 165 lb 3.2 oz (74.9 kg)  09/18/23 168 lb (76.2 kg)  08/07/23 163 lb (73.9 kg)     GENERAL:  Well nourished, well developed in no acute distress NECK: No JVD; No carotid bruits CARDIAC: RRR, S1 and S2 present, no murmurs, no rubs, no gallops CHEST:  Clear to auscultation without rales, wheezing or rhonchi  Extremities: No pitting pedal edema. Pulses bilaterally symmetric with radial 2+ and dorsalis pedis 2+ NEUROLOGIC:  Alert and oriented x 3  Medication Adjustments/Labs and Tests Ordered: Current medicines are reviewed at length with the patient today.  Concerns regarding medicines are outlined above.  No orders of the  defined types were placed in this encounter.  No orders of the defined types were placed in this encounter.   Signed, Alean jess Kobus, MD, MPH, [facility]. 05/06/2024 9:06 AM    [facility]

## 2024-05-06 NOTE — Patient Instructions (Signed)
 Medication Instructions:  Your physician has recommended you make the following change in your medication:   Start Chlorthalidone 25 mg daily   *If you need a refill on your cardiac medications before your next appointment, please call your pharmacy*   Lab Work: Your physician recommends that you return for lab work in: next visit for fasting lipids You need to have labs done when you are fasting.  You can come Monday through Friday 8:30 am to 12:00 pm and 1:15 to 4:30. You do not need to make an appointment as the order has already been placed.   If you have labs (blood work) drawn today and your tests are completely normal, you will receive your results only by: MyChart Message (if you have MyChart) OR A paper copy in the mail If you have any lab test that is abnormal or we need to change your treatment, we will call you to review the results.   Testing/Procedures: None ordered   Follow-Up: At [facility], you and your health needs are our priority.  As part of our continuing mission to provide you with exceptional heart care, we have created designated Provider Care Teams.  These Care Teams include your primary Cardiologist (physician) and Advanced Practice Providers (APPs -  Physician Assistants and Nurse Practitioners) who all work together to provide you with the care you need, when you need it.  We recommend signing up for the patient portal called MyChart.  Sign up information is provided on this After Visit Summary.  MyChart is used to connect with patients for Virtual Visits (Telemedicine).  Patients are able to view lab/test results, encounter notes, upcoming appointments, etc.  Non-urgent messages can be sent to your provider as well.   To learn more about what you can do with MyChart, go to [email or web address].    Your next appointment:   3 month(s)  The format for your next appointment:   In Person  Provider:   Alean Kobus, MD    Other  Instructions none  Important Information About Sugar

## 2024-08-30 ENCOUNTER — Ambulatory Visit
